# Patient Record
Sex: Male | Born: 1950 | Race: White | Hispanic: No | Marital: Married | Smoking: Never smoker
Health system: Southern US, Community
[De-identification: ages and names within clinical notes are randomized; demographics above are authoritative.]

## PROBLEM LIST (undated history)

## (undated) ENCOUNTER — Emergency Department (HOSPITAL_COMMUNITY): Admission: EM | Discharge status: Absent without leave | Payer: Self-pay

## (undated) DIAGNOSIS — J309 Allergic rhinitis, unspecified: Secondary | ICD-10-CM

## (undated) DIAGNOSIS — I10 Essential (primary) hypertension: Secondary | ICD-10-CM

## (undated) DIAGNOSIS — T148XXA Other injury of unspecified body region, initial encounter: Secondary | ICD-10-CM

## (undated) DIAGNOSIS — I77810 Thoracic aortic ectasia: Secondary | ICD-10-CM

## (undated) DIAGNOSIS — K56609 Unspecified intestinal obstruction, unspecified as to partial versus complete obstruction: Secondary | ICD-10-CM

## (undated) DIAGNOSIS — Z8719 Personal history of other diseases of the digestive system: Secondary | ICD-10-CM

## (undated) DIAGNOSIS — I6381 Other cerebral infarction due to occlusion or stenosis of small artery: Secondary | ICD-10-CM

## (undated) DIAGNOSIS — D693 Immune thrombocytopenic purpura: Secondary | ICD-10-CM

## (undated) DIAGNOSIS — R339 Retention of urine, unspecified: Secondary | ICD-10-CM

## (undated) DIAGNOSIS — F439 Reaction to severe stress, unspecified: Secondary | ICD-10-CM

## (undated) DIAGNOSIS — U071 COVID-19: Secondary | ICD-10-CM

## (undated) DIAGNOSIS — E785 Hyperlipidemia, unspecified: Secondary | ICD-10-CM

## (undated) DIAGNOSIS — M5432 Sciatica, left side: Secondary | ICD-10-CM

## (undated) DIAGNOSIS — G4700 Insomnia, unspecified: Secondary | ICD-10-CM

## (undated) DIAGNOSIS — K219 Gastro-esophageal reflux disease without esophagitis: Secondary | ICD-10-CM

## (undated) DIAGNOSIS — R002 Palpitations: Secondary | ICD-10-CM

## (undated) DIAGNOSIS — I700 Atherosclerosis of aorta: Secondary | ICD-10-CM

## (undated) DIAGNOSIS — K760 Fatty (change of) liver, not elsewhere classified: Secondary | ICD-10-CM

## (undated) DIAGNOSIS — K7581 Nonalcoholic steatohepatitis (NASH): Secondary | ICD-10-CM

## (undated) DIAGNOSIS — K439 Ventral hernia without obstruction or gangrene: Secondary | ICD-10-CM

## (undated) DIAGNOSIS — G2581 Restless legs syndrome: Secondary | ICD-10-CM

## (undated) DIAGNOSIS — N401 Enlarged prostate with lower urinary tract symptoms: Secondary | ICD-10-CM

## (undated) DIAGNOSIS — R7303 Prediabetes: Secondary | ICD-10-CM

## (undated) HISTORY — DX: Fatty (change of) liver, not elsewhere classified: K76.0

## (undated) HISTORY — DX: Other cerebral infarction due to occlusion or stenosis of small artery: I63.81

## (undated) HISTORY — DX: Sciatica, left side: M54.32

## (undated) HISTORY — DX: Thoracic aortic ectasia: I77.810

## (undated) HISTORY — DX: Palpitations: R00.2

## (undated) HISTORY — DX: Immune thrombocytopenic purpura: D69.3

## (undated) HISTORY — DX: Morbid (severe) obesity due to excess calories: E66.01

## (undated) HISTORY — DX: Other injury of unspecified body region, initial encounter: T14.8XXA

## (undated) HISTORY — DX: Allergic rhinitis, unspecified: J30.9

## (undated) HISTORY — DX: Restless legs syndrome: G25.81

## (undated) HISTORY — DX: Gilbert syndrome: E80.4

## (undated) HISTORY — DX: Benign prostatic hyperplasia with lower urinary tract symptoms: N40.1

## (undated) HISTORY — DX: Unspecified intestinal obstruction, unspecified as to partial versus complete obstruction: K56.609

## (undated) HISTORY — DX: Ventral hernia without obstruction or gangrene: K43.9

## (undated) HISTORY — DX: Insomnia, unspecified: G47.00

## (undated) HISTORY — DX: Nonalcoholic steatohepatitis (NASH): K75.81

## (undated) HISTORY — DX: Gastro-esophageal reflux disease without esophagitis: K21.9

## (undated) HISTORY — DX: COVID-19: U07.1

## (undated) HISTORY — DX: Retention of urine, unspecified: R33.9

## (undated) HISTORY — DX: Personal history of other diseases of the digestive system: Z87.19

## (undated) HISTORY — DX: Prediabetes: R73.03

## (undated) HISTORY — DX: Atherosclerosis of aorta: I70.0

## (undated) HISTORY — DX: Hyperlipidemia, unspecified: E78.5

## (undated) HISTORY — DX: Essential (primary) hypertension: I10

## (undated) HISTORY — DX: Reaction to severe stress, unspecified: F43.9

---

## 1998-01-06 ENCOUNTER — Ambulatory Visit (HOSPITAL_COMMUNITY): Admission: RE | Admit: 1998-01-06 | Discharge: 1998-01-06 | Payer: Self-pay | Admitting: Specialist

## 1998-01-19 ENCOUNTER — Ambulatory Visit (HOSPITAL_COMMUNITY): Admission: RE | Admit: 1998-01-19 | Discharge: 1998-01-20 | Payer: Self-pay | Admitting: Surgery

## 1998-02-03 ENCOUNTER — Ambulatory Visit (HOSPITAL_COMMUNITY): Admission: RE | Admit: 1998-02-03 | Discharge: 1998-02-03 | Payer: Self-pay | Admitting: Specialist

## 1998-02-06 ENCOUNTER — Ambulatory Visit (HOSPITAL_COMMUNITY): Admission: RE | Admit: 1998-02-06 | Discharge: 1998-02-06 | Payer: Self-pay | Admitting: Specialist

## 1998-02-13 ENCOUNTER — Ambulatory Visit (HOSPITAL_COMMUNITY): Admission: RE | Admit: 1998-02-13 | Discharge: 1998-02-13 | Payer: Self-pay | Admitting: Specialist

## 1998-02-27 ENCOUNTER — Ambulatory Visit (HOSPITAL_COMMUNITY): Admission: RE | Admit: 1998-02-27 | Discharge: 1998-03-01 | Payer: Self-pay | Admitting: Specialist

## 2000-03-04 ENCOUNTER — Encounter (INDEPENDENT_AMBULATORY_CARE_PROVIDER_SITE_OTHER): Payer: Self-pay

## 2000-03-04 ENCOUNTER — Inpatient Hospital Stay (HOSPITAL_COMMUNITY): Admission: EM | Admit: 2000-03-04 | Discharge: 2000-03-17 | Payer: Self-pay

## 2000-03-09 ENCOUNTER — Encounter: Payer: Self-pay | Admitting: General Surgery

## 2000-03-10 ENCOUNTER — Encounter: Payer: Self-pay | Admitting: General Surgery

## 2009-03-16 ENCOUNTER — Ambulatory Visit: Payer: Self-pay | Admitting: Hematology and Oncology

## 2009-03-20 LAB — CBC WITH DIFFERENTIAL/PLATELET
Eosinophils Absolute: 0.1 10*3/uL (ref 0.0–0.5)
HCT: 48.2 % (ref 38.4–49.9)
LYMPH%: 33.5 % (ref 14.0–49.0)
MCV: 91.7 fL (ref 79.3–98.0)
MONO#: 0.4 10*3/uL (ref 0.1–0.9)
MONO%: 6.4 % (ref 0.0–14.0)
NEUT#: 3.2 10*3/uL (ref 1.5–6.5)
NEUT%: 57.8 % (ref 39.0–75.0)
Platelets: 114 10*3/uL — ABNORMAL LOW (ref 140–400)
WBC: 5.6 10*3/uL (ref 4.0–10.3)

## 2009-03-20 LAB — COMPREHENSIVE METABOLIC PANEL
BUN: 13 mg/dL (ref 6–23)
CO2: 27 mEq/L (ref 19–32)
Calcium: 9.5 mg/dL (ref 8.4–10.5)
Chloride: 103 mEq/L (ref 96–112)
Creatinine, Ser: 1.09 mg/dL (ref 0.40–1.50)
Glucose, Bld: 100 mg/dL — ABNORMAL HIGH (ref 70–99)

## 2009-03-24 LAB — PROTEIN ELECTROPHORESIS, SERUM, WITH REFLEX
Albumin ELP: 66.6 % — ABNORMAL HIGH (ref 55.8–66.1)
Alpha-2-Globulin: 7.8 % (ref 7.1–11.8)
Beta Globulin: 6.4 % (ref 4.7–7.2)
Total Protein, Serum Electrophoresis: 7.6 g/dL (ref 6.0–8.3)

## 2009-03-27 ENCOUNTER — Ambulatory Visit (HOSPITAL_COMMUNITY): Admission: RE | Admit: 2009-03-27 | Discharge: 2009-03-27 | Payer: Self-pay | Admitting: Hematology and Oncology

## 2009-04-22 ENCOUNTER — Ambulatory Visit: Payer: Self-pay | Admitting: Hematology and Oncology

## 2009-04-24 LAB — CBC WITH DIFFERENTIAL/PLATELET
Basophils Absolute: 0 10*3/uL (ref 0.0–0.1)
Eosinophils Absolute: 0.1 10*3/uL (ref 0.0–0.5)
HCT: 46.8 % (ref 38.4–49.9)
LYMPH%: 38 % (ref 14.0–49.0)
MCV: 92.1 fL (ref 79.3–98.0)
MONO#: 0.4 10*3/uL (ref 0.1–0.9)
MONO%: 7.7 % (ref 0.0–14.0)
NEUT#: 2.6 10*3/uL (ref 1.5–6.5)
NEUT%: 52.5 % (ref 39.0–75.0)
Platelets: 84 10*3/uL — ABNORMAL LOW (ref 140–400)
WBC: 5 10*3/uL (ref 4.0–10.3)

## 2009-04-24 LAB — IVY BLEEDING TIME: Bleeding Time: 8.5 Minutes — ABNORMAL HIGH (ref 2.0–8.0)

## 2009-04-26 LAB — THROMBIN TIME

## 2009-05-06 ENCOUNTER — Ambulatory Visit (HOSPITAL_COMMUNITY): Admission: RE | Admit: 2009-05-06 | Discharge: 2009-05-06 | Payer: Self-pay | Admitting: Hematology and Oncology

## 2009-05-06 ENCOUNTER — Encounter (INDEPENDENT_AMBULATORY_CARE_PROVIDER_SITE_OTHER): Payer: Self-pay | Admitting: Interventional Radiology

## 2009-05-08 LAB — CBC WITH DIFFERENTIAL/PLATELET
Basophils Absolute: 0 10*3/uL (ref 0.0–0.1)
Eosinophils Absolute: 0.1 10*3/uL (ref 0.0–0.5)
HGB: 16.6 g/dL (ref 13.0–17.1)
MCV: 91.1 fL (ref 79.3–98.0)
MONO#: 0.3 10*3/uL (ref 0.1–0.9)
MONO%: 6.3 % (ref 0.0–14.0)
NEUT#: 3 10*3/uL (ref 1.5–6.5)
Platelets: 86 10*3/uL — ABNORMAL LOW (ref 140–400)
RBC: 5.11 10*6/uL (ref 4.20–5.82)
RDW: 13.2 % (ref 11.0–14.6)
WBC: 5.4 10*3/uL (ref 4.0–10.3)

## 2009-06-02 ENCOUNTER — Ambulatory Visit: Payer: Self-pay | Admitting: Hematology and Oncology

## 2009-06-04 LAB — CBC WITH DIFFERENTIAL/PLATELET
BASO%: 0.5 % (ref 0.0–2.0)
Basophils Absolute: 0 10e3/uL (ref 0.0–0.1)
EOS%: 0.9 % (ref 0.0–7.0)
Eosinophils Absolute: 0 10e3/uL (ref 0.0–0.5)
HCT: 46 % (ref 38.4–49.9)
HGB: 16.2 g/dL (ref 13.0–17.1)
LYMPH%: 32 % (ref 14.0–49.0)
MCH: 32.6 pg (ref 27.2–33.4)
MCHC: 35.3 g/dL (ref 32.0–36.0)
MCV: 92.3 fL (ref 79.3–98.0)
MONO#: 0.5 10e3/uL (ref 0.1–0.9)
MONO%: 9 % (ref 0.0–14.0)
NEUT#: 3.1 10e3/uL (ref 1.5–6.5)
NEUT%: 57.6 % (ref 39.0–75.0)
Platelets: 79 10e3/uL — ABNORMAL LOW (ref 140–400)
RBC: 4.98 10e6/uL (ref 4.20–5.82)
RDW: 13.1 % (ref 11.0–14.6)
WBC: 5.4 10e3/uL (ref 4.0–10.3)
lymph#: 1.7 10e3/uL (ref 0.9–3.3)

## 2009-07-07 ENCOUNTER — Ambulatory Visit: Payer: Self-pay | Admitting: Hematology and Oncology

## 2009-07-09 LAB — COMPREHENSIVE METABOLIC PANEL
Albumin: 4.3 g/dL (ref 3.5–5.2)
Alkaline Phosphatase: 57 U/L (ref 39–117)
Calcium: 9.3 mg/dL (ref 8.4–10.5)
Chloride: 103 mEq/L (ref 96–112)
Glucose, Bld: 97 mg/dL (ref 70–99)
Potassium: 4 mEq/L (ref 3.5–5.3)
Sodium: 143 mEq/L (ref 135–145)
Total Protein: 6.2 g/dL (ref 6.0–8.3)

## 2009-07-09 LAB — CBC WITH DIFFERENTIAL/PLATELET
Eosinophils Absolute: 0.1 10*3/uL (ref 0.0–0.5)
HGB: 16.1 g/dL (ref 13.0–17.1)
MCV: 93.1 fL (ref 79.3–98.0)
MONO#: 0.4 10*3/uL (ref 0.1–0.9)
MONO%: 6.3 % (ref 0.0–14.0)
NEUT#: 2.9 10*3/uL (ref 1.5–6.5)
RBC: 4.88 10*6/uL (ref 4.20–5.82)
RDW: 13.4 % (ref 11.0–14.6)
WBC: 5.8 10*3/uL (ref 4.0–10.3)
lymph#: 2.3 10*3/uL (ref 0.9–3.3)

## 2009-07-10 ENCOUNTER — Ambulatory Visit: Payer: Self-pay | Admitting: Hematology & Oncology

## 2009-07-13 LAB — CBC WITH DIFFERENTIAL (CANCER CENTER ONLY)
BASO#: 0.1 10*3/uL (ref 0.0–0.2)
Eosinophils Absolute: 0.2 10*3/uL (ref 0.0–0.5)
HGB: 16.8 g/dL (ref 13.0–17.1)
MCH: 32.7 pg (ref 28.0–33.4)
MCV: 94 fL (ref 82–98)
MONO#: 0.2 10*3/uL (ref 0.1–0.9)
MONO%: 3.9 % (ref 0.0–13.0)
NEUT#: 3.8 10*3/uL (ref 1.5–6.5)
RBC: 5.13 10*6/uL (ref 4.20–5.70)
WBC: 6 10*3/uL (ref 4.0–10.0)

## 2009-07-13 LAB — COMPREHENSIVE METABOLIC PANEL
Alkaline Phosphatase: 61 U/L (ref 39–117)
BUN: 9 mg/dL (ref 6–23)
Glucose, Bld: 130 mg/dL — ABNORMAL HIGH (ref 70–99)
Sodium: 140 mEq/L (ref 135–145)
Total Bilirubin: 1.4 mg/dL — ABNORMAL HIGH (ref 0.3–1.2)
Total Protein: 6.6 g/dL (ref 6.0–8.3)

## 2009-10-22 ENCOUNTER — Ambulatory Visit: Payer: Self-pay | Admitting: Hematology & Oncology

## 2009-10-23 LAB — CBC WITH DIFFERENTIAL (CANCER CENTER ONLY)
BASO#: 0 10*3/uL (ref 0.0–0.2)
BASO%: 0.8 % (ref 0.0–2.0)
EOS%: 2.3 % (ref 0.0–7.0)
Eosinophils Absolute: 0.1 10*3/uL (ref 0.0–0.5)
HCT: 47.7 % (ref 38.7–49.9)
HGB: 16.5 g/dL (ref 13.0–17.1)
LYMPH#: 2.1 10*3/uL (ref 0.9–3.3)
LYMPH%: 39.6 % (ref 14.0–48.0)
MCH: 32.8 pg (ref 28.0–33.4)
MCHC: 34.7 g/dL (ref 32.0–35.9)
MCV: 94 fL (ref 82–98)
MONO#: 0.2 10*3/uL (ref 0.1–0.9)
MONO%: 3.5 % (ref 0.0–13.0)
NEUT#: 2.8 10*3/uL (ref 1.5–6.5)
NEUT%: 53.8 % (ref 40.0–80.0)
Platelets: 76 10*3/uL — ABNORMAL LOW (ref 145–400)
RBC: 5.05 10*6/uL (ref 4.20–5.70)
RDW: 11.2 % (ref 10.5–14.6)
WBC: 5.3 10*3/uL (ref 4.0–10.0)

## 2009-10-23 LAB — CHCC SATELLITE - SMEAR

## 2010-02-17 ENCOUNTER — Ambulatory Visit: Payer: Self-pay | Admitting: Hematology & Oncology

## 2010-02-19 LAB — CBC WITH DIFFERENTIAL (CANCER CENTER ONLY)
BASO#: 0.1 10*3/uL (ref 0.0–0.2)
BASO%: 1.8 % (ref 0.0–2.0)
EOS%: 2.4 % (ref 0.0–7.0)
Eosinophils Absolute: 0.1 10*3/uL (ref 0.0–0.5)
HCT: 49.7 % (ref 38.7–49.9)
HGB: 17 g/dL (ref 13.0–17.1)
LYMPH#: 1.9 10*3/uL (ref 0.9–3.3)
LYMPH%: 40.8 % (ref 14.0–48.0)
MCH: 32.6 pg (ref 28.0–33.4)
MCHC: 34.2 g/dL (ref 32.0–35.9)
MCV: 95 fL (ref 82–98)
MONO#: 0.2 10*3/uL (ref 0.1–0.9)
MONO%: 4.2 % (ref 0.0–13.0)
NEUT#: 2.3 10*3/uL (ref 1.5–6.5)
NEUT%: 50.8 % (ref 40.0–80.0)
Platelets: 78 10*3/uL — ABNORMAL LOW (ref 145–400)
RBC: 5.21 10*6/uL (ref 4.20–5.70)
RDW: 11.4 % (ref 10.5–14.6)
WBC: 4.6 10*3/uL (ref 4.0–10.0)

## 2010-02-19 LAB — CHCC SATELLITE - SMEAR

## 2010-08-18 ENCOUNTER — Ambulatory Visit: Payer: Self-pay | Admitting: Hematology & Oncology

## 2010-08-20 LAB — CBC WITH DIFFERENTIAL (CANCER CENTER ONLY)
BASO%: 1 % (ref 0.0–2.0)
EOS%: 2.2 % (ref 0.0–7.0)
LYMPH%: 34.2 % (ref 14.0–48.0)
MCH: 32 pg (ref 28.0–33.4)
MCHC: 33.9 g/dL (ref 32.0–35.9)
MCV: 95 fL (ref 82–98)
MONO%: 6 % (ref 0.0–13.0)
Platelets: 68 10*3/uL — ABNORMAL LOW (ref 145–400)
RDW: 12.2 % (ref 10.5–14.6)

## 2010-08-20 LAB — CHCC SATELLITE - SMEAR

## 2011-01-08 LAB — CBC
HCT: 47 % (ref 39.0–52.0)
Platelets: 77 10*3/uL — ABNORMAL LOW (ref 150–400)
RDW: 13.1 % (ref 11.5–15.5)

## 2011-01-08 LAB — PROTIME-INR: INR: 1.1 (ref 0.00–1.49)

## 2011-01-08 LAB — BONE MARROW EXAM

## 2011-02-11 ENCOUNTER — Other Ambulatory Visit: Payer: Self-pay | Admitting: Hematology & Oncology

## 2011-02-11 ENCOUNTER — Encounter (HOSPITAL_BASED_OUTPATIENT_CLINIC_OR_DEPARTMENT_OTHER): Payer: 59 | Admitting: Hematology & Oncology

## 2011-02-11 DIAGNOSIS — D693 Immune thrombocytopenic purpura: Secondary | ICD-10-CM

## 2011-02-11 DIAGNOSIS — D696 Thrombocytopenia, unspecified: Secondary | ICD-10-CM

## 2011-02-11 LAB — CBC WITH DIFFERENTIAL (CANCER CENTER ONLY)
BASO%: 0.4 % (ref 0.0–2.0)
EOS%: 1.8 % (ref 0.0–7.0)
LYMPH#: 1.9 10*3/uL (ref 0.9–3.3)
MCH: UNDETERMINED pg (ref 28.0–33.4)
MCHC: UNDETERMINED g/dL (ref 32.0–35.9)
MONO%: 6.6 % (ref 0.0–13.0)
NEUT#: 3.1 10*3/uL (ref 1.5–6.5)
Platelets: 62 10*3/uL — ABNORMAL LOW (ref 145–400)
RDW: UNDETERMINED % (ref 11.1–15.7)

## 2011-02-18 NOTE — H&P (Signed)
[facility]  Patient:    Eric Mckenzie, Eric Mckenzie                    MRN: [id number] Adm. Date:  [id number] Attending:  Henrene Dodge CC:         Anselm Pancoast. Zachery Dakins, M.D.                         History and Physical  CHIEF COMPLAINT:  Abdominal distention and nausea.  HISTORY:  Eric Mckenzie is a 60 year old overweight Caucasian male who was referred from the [facility] this morning where he presented with a 48-hour history of bloating, cramping, abdominal distention and feeling like he may vomit.  On findings, he was diffusely tender throughout his abdomen, not really localized to any quadrant of the abdomen, and his only previous surgery has been a laparoscopic cholecystectomy and a little umbilical hernia that had been repaired.  In the emergency room, we got a flat and upright abdominal film that showed very dilated loops of small bowel with multiple air-fluid levels and then there was a little gas in the colon.  I placed a nasogastric tube.  His lab studies were basically unremarkable except for hematocrit that was elevated because he was dehydrated.  With the NG tube, about 1200 cc were removed, and I recommended we proceed on with exploratory laparotomy after he was rehydrated.  Patient was in agreement with this.  ALLERGIES:  None.  CHRONIC MEDICATIONS:  I do not think he is on any.  PREVIOUS SURGERIES:  Of course he had a knee operation and he has had a laparoscopic cholecystectomy.  FAMILY HISTORY:  Unremarkable.  He is married and has two children.  REVIEW OF SYSTEMS:  HEENT:  Negative.  No chronic pulmonary problems.  I do not think he smokes.  Denies chronic problem with his bowels.  PHYSICAL EXAMINATION:  GENERAL:  He is an overweight Caucasian male in mild distress, tachycardic, but is not febrile.  VITAL SIGNS:  In the emergency room here, his pulse was 119.  Blood pressure was mildly elevated, but it had  not been elevated at the [facility].  We measured at 145; I think it was 106.  Respirations were unremarkable. Temperature was normal.  HEENT:  Unremarkable.  Appears slightly dehydrated.  Nasogastric tube was placed in the ER.  LUNGS:  Clear.  CARDIAC:  Sinus tachycardia without murmur, gallop or rub.  ABDOMEN:  High-pitched bowel sounds, vaguely tender in all four quadrants but not acutely tender like rigid.  RECTAL:  There was brown Hemoccult stool in the rectum, not very hard.  EXTREMITIES:  Unremarkable.  CNS:  Physiologic.  SKIN:  Unremarkable.  ADMISSION IMPRESSION:  Small-bowel obstruction, probably adhesions.  PLAN:  The patient will be rehydrated, nasogastric tube and plan for exploratory laparotomy later this afternoon. DD:  03/04/00 TD:  03/06/00 Job: [id number]

## 2011-02-18 NOTE — Op Note (Signed)
[facility]  Patient:    Eric Mckenzie, Eric Mckenzie                    MRN: [id number] Proc. Date: 03/04/00 Adm. Date:  [id number] Attending:  Henrene Dodge CC:         Gordy Savers, M.D. [facility]                           Operative Report  PREOPERATIVE DIAGNOSIS:  Small-bowel obstruction, probably adhesions.  POSTOPERATIVE DIAGNOSIS:  Small-bowel obstruction, ______ of adhesions.  OPERATION:  Exploratory laparotomy, lysis of adhesions and incidental appendectomy.  SURGEON:  Anselm Pancoast. Zachery Dakins, M.D.  ASSISTANT:  Nurse.  ANESTHESIA:  General.  INDICATIONS:  Eric Mckenzie is a 60 year old Caucasian male, moderately overweight who approximately for 48 hours has had progressive abdominal bloating, nausea and not actually vomited, saw his physician this morning.  He is normally followed by Dr. Amador Cunas, at the [facility] where they found him bloated and diffusely tender and referred him over to the ER and asked me to see him.  On physical examination he had sort of cramping high-pitch obstructive type bowel sounds.  X-rays confirmed that he had a small-bowel obstruction and his lab studies were basically unremarkable except that he was dehydrated and hematocrit was about 53.  A nasogastric tube was placed, started on intravenous fluids, and I recommended that we proceed with exploratory laparotomy.  After the patient had received about two liters of fluids his pulse had come down from about 119 to about 100 and he was taken to surgery where we gave him 3 g of Unasyn preoperatively.  DESCRIPTION OF PROCEDURE:  After being prepped with Betadine surgical scrub and solution, the patients midline incision was opened up.  He had had a previous laparoscopic cholecystectomy approximately two years ago.  A small midline incision from above and below the umbilicus was made and upon entering into the peritoneal cavity, there were markedly  dilated loops of small bowel, a lot of fluid within the peritoneum, exudate type but never foul smelling or bloody.  The incision was extended since the obstruction appeared to be down along the appendiceal area.  In opening up the incision and following the very dilated loops of small bowel down at approximately about a foot from the ileocecal valve there was appendices epiploic from the sigmoid colon sort of adherent to the omentum and this was kind of crossed over the small bowel, the point of obstruction.  This was released and the air went onto the cecum and the appendix was kind of in a retrocecal position and not acutely inflamed.  I milked back the small bowel contents and after we got a little better exposure, then I could sort of rotate the cecum forward and went ahead and freed up the retrocecal appendix and did an incidental appendectomy.  The appendix was cross-clamped about a  inch from its base with 2-0 Vicryl and then the appendicial stump inverted with 3-0 silk.  The small bowel was then placed back in anatomical position.  In his colon he has kind of a moderate amount of diverticulosis and very large appendices of the plica; part of this of course is related to his obesity, but I expect that he has had an element of diverticulosis also.  There was some adhesions up around the gallbladder but no evidence of obstruction and as far as ______ palpation of the  colon best I could tell.  I cannot find any lesions.  There is some stool in the sigmoid colon and rectum but the transverse colon and other parts of the colon were not dilated.  The omentum was freed up and brought down over the small bowel and then the midline was closed with a figure-of-eight sutures of #1 Novofil.  The skin was closed with staples.  The patient tolerated the procedure satisfactory and we placed Foley catheter and he had about 300 cc of urine output.  We will plan on keeping the nasogastric tube and I  am going to use PCA morphine for postop pain control. DD:  03/04/00 TD:  03/08/00 Job: [id number]

## 2011-02-18 NOTE — Discharge Summary (Signed)
[facility]  Patient:    Eric Mckenzie, Eric Mckenzie                    MRN: [id number] Adm. Date:  [id number] Disc. Date: [id number] Attending:  Henrene Dodge CC:         Anselm Pancoast. Zachery Dakins, M.D.             Gordy Savers, M.D. [facility]                           Discharge Summary  DISCHARGE DIAGNOSES: 1. Small bowel obstruction secondary to adhesions. 2. Postoperative ileus.  OPERATIONS:  Exploratory laparotomy and lysis of adhesions and incidental appendectomy.  HISTORY OF PRESENT ILLNESS:  Eric Mckenzie is a 60 year old moderately overweight Caucasian male who was admitted through the ER on March 04, 2000, with a several week history of kind of progressive abdominal bloating but then a several day history of marked bloating, nausea, and feeling like he was just getting ready to vomit.  When he was seen in the emergency room, the ER physician, Gordy Savers, M.D., is his regular physician, x-rays were obtained that showed multiple air-fluid levels with maximum distention of the small bowel, very little if any gas within the colon, and his lab studies showed white count of 9500 but a hematocrit of 52.  His CMET showed basically normal electrolytes.  His creatinine was 1.2, BUN of 12, and on examination he had a very distended, quite tight abdomen.  His admission bilirubin was slightly elevated at 1.7, but his other liver function studies were normal.  I placed a nasogastric tube, got a moderate amount of bilious drainage, and re-examined him an hour or two later and felt that with his really tight abdominal distention, it would be best to proceed on with exploratory laparotomy for mechanical small bowel obstruction.  HOSPITAL COURSE:  He was taken to surgery, and at surgery he had a very dilated small bowel.  The point of obstruction was down approximately two feet from the terminal ileum and related to basically an appendices epiploica  on the colon kind of partially blocking this area.  With this being released, the gas went on into the colon and he was also noted to have a moderate amount of stool throughout the transverse and even right colon as well as left colon. 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  He noted that he was kind of bloating, cramping, and that this was kind of more of a gradual than just a couple of day episode of abdominal pain and distention.  Postoperatively we kept the nasogastric tube, and I had basically milked the contents back into the stomach that we could remove with the NG tube, probably two or more liters of stuff was removed, and then postoperatively he continued just having kind of a bloated, bilious NG drainage and then with Dulcolax suppositories and a Fleets enema, etc., and on about the fifth day he started having some bowel function.  At this time his abdomen was much flatter.  We removed the nasogastric tube but over a period of 24 hours, he definitely became more bloated.  He had another bowel movement but was obviously nauseated, and it was necessary to replace the NG  tube.  At this point, I was concerned that was there some way I could have possibly missed a colon obstruction, and thought that it would be best to check the colon, and this was done with a Gastrografin enema, and this was done on the 7th, which was about six days after surgery, and at that time there was still solid stool in the transverse colon but there was no evidence of any obstruction or anything in the colon itself.  On the delayed films, we had refluxed up into the terminal ileum.  We continued with the nasogastric tube and over the next about three days, he continued to basically have small bowel movements, passed a little gas, still was having a  bilious NG drainage, and still was somewhat nauseated.  I was off that particular weekend, and on returning on Monday, I elected to go ahead and start him on Reglan and with starting on the Reglan, there was a fairly prompt decreased NG drainage.  The drainage that previously had been bilious became nonbilious, and then we clamped the NG tube over a couple of days and he continued to be passing gas and bowels working satisfactorily.  I therefore removed the nasogastric tube approximately three days ago, kept him n.p.o. that day, and he had no further nausea, and then started him on a full liquid diet, which he has tolerated without any problems.  Yesterday, 24 hours after starting the full liquid diet, we stopped the Reglan intravenously and gave it to him p.o., and he has continued to have good bowel function, not bloated, abdomen soft.  His NG tube has now been removed, and I think he is ready for discharge in improved condition at this time.  On repeat lab studies about four or five days ago, it was noted that his bilirubin that had been 1.7 on admission was down to about 1.1, and a slight increased SGOT and SGPT of 115 and 191, respectively, but he has never had any pain up in the right upper quadrant.  Now that his bowels are working, his bilirubin is down to 1.1, and his SGOT and SGPT are better, but they are not perfectly normal at this time. He is having no abdominal pain, and I think he is ready for discharge in improved condition.  He is no longer requiring any type of pain medication, and I think he is extremely sensitive to the narcotic.  We were using PCA morphine in the immediate postoperative period, and I think this has probably contributed to the postoperative ileus.  I think we will discharge him only on Reglan and if he has any pain, he can take a Tylenol or an aspirin, and I would like to see him in the office in approximately three to four days. I have instructed him to  gradually increase the solid nature of his diet so that over a period of about two to three days, he is up to a regular diet.  I will  continue on the Reglan at least for a week, and then we will sort of taper it off and see if there is any needed afterwards.  I will plan on seeing him in the office in approximately five days.  His staples have been removed, his wound steri-stripped, and his wound is healing without problems.  He is discharged in improved condition.  He hopes to be able to return to work in approximately one week, which will be approximately three weeks from the time  of his surgery. DD:  03/17/00 TD:  03/22/00 Job: [id number]

## 2011-03-10 IMAGING — CT CT BIOPSY
1 series · 15 of 32 positions shown, 19 images · non-contrast
Comparison: none

CLINICAL DATA: Unexplained thrombocytopenia.  The patient requires
bone marrow biopsy.

[Series 2: routine pelvis · axial · 0.81mm/px · z∈[-180,-94]mm · 15 of 43 slices shown, 19 images]
[im 3/43  soft-tissue]
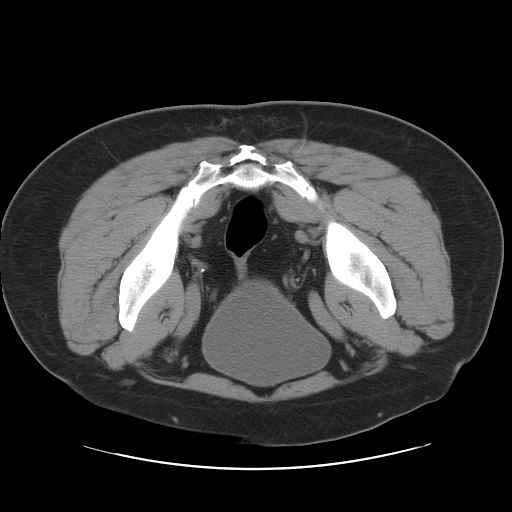
[im 3/43  bone]
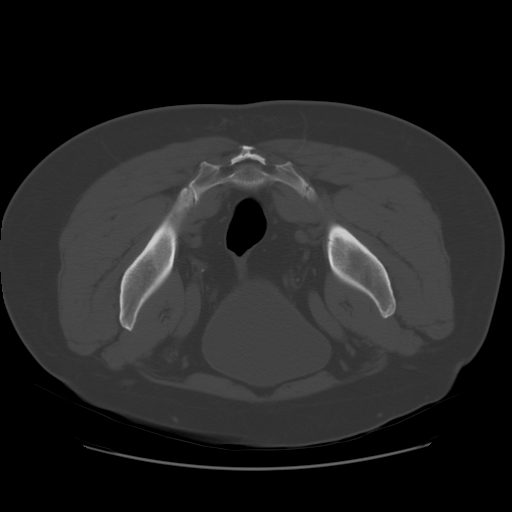
[im 6/43  soft-tissue]
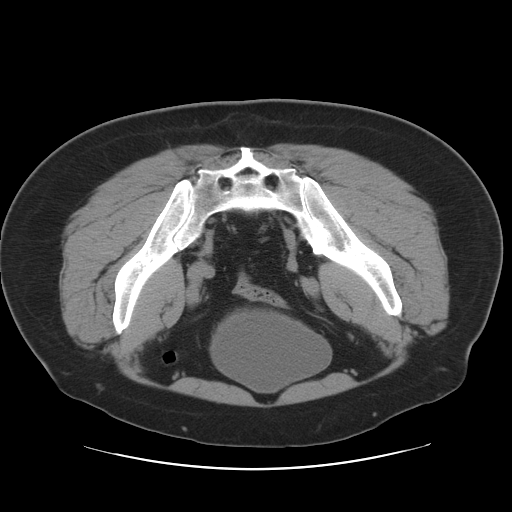
[im 9/43  soft-tissue]
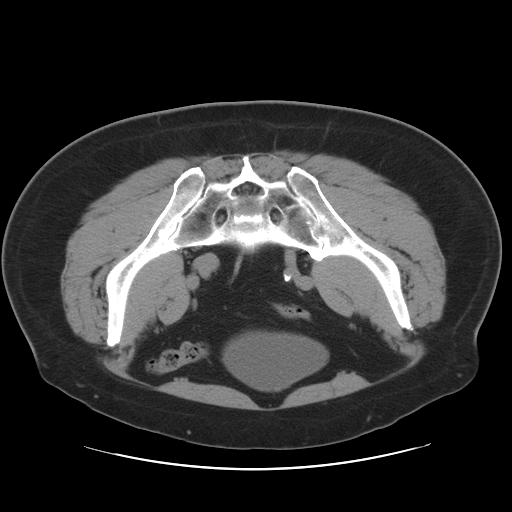
[im 13/43  soft-tissue]
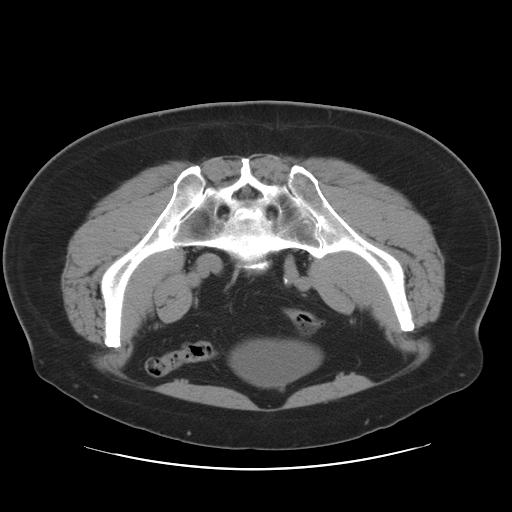
[im 15/43  soft-tissue]
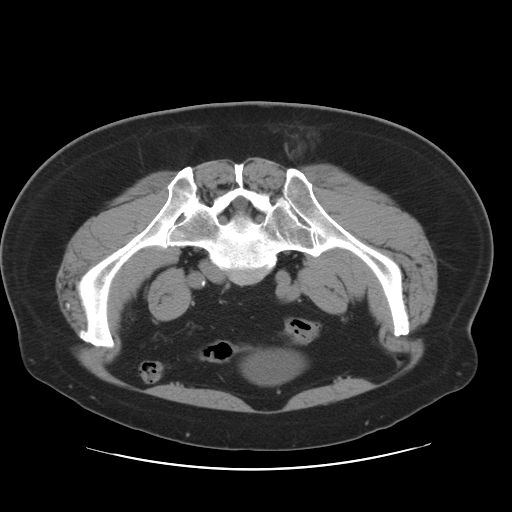
[im 18/43  soft-tissue]
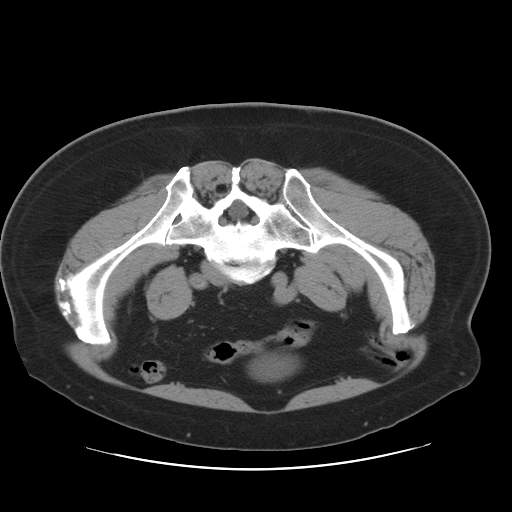
[im 22/43  soft-tissue]
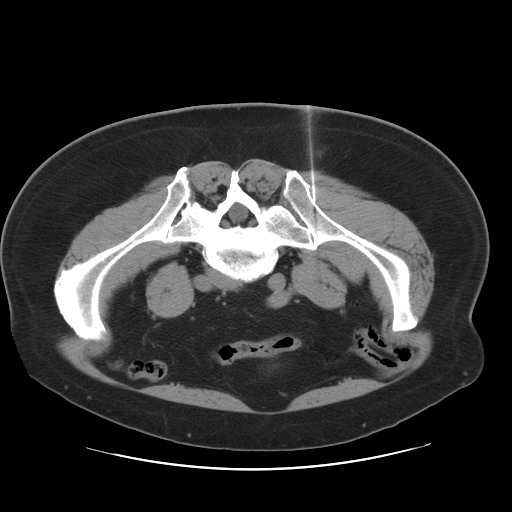
[im 25/43  soft-tissue]
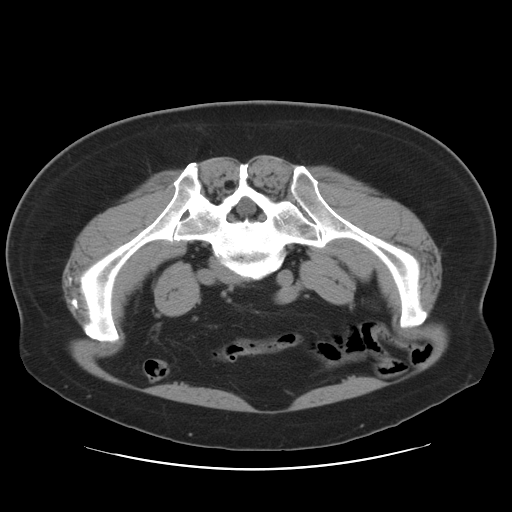
[im 28/43  soft-tissue]
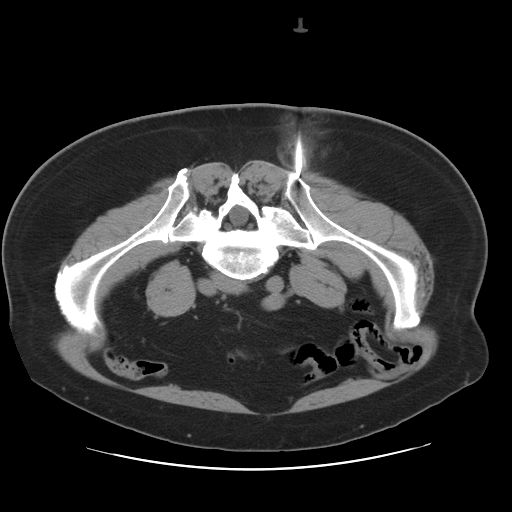
[im 28/43  bone]
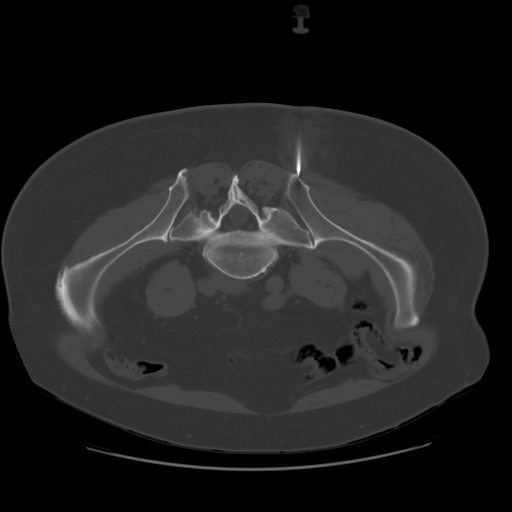
[im 30/43  soft-tissue]
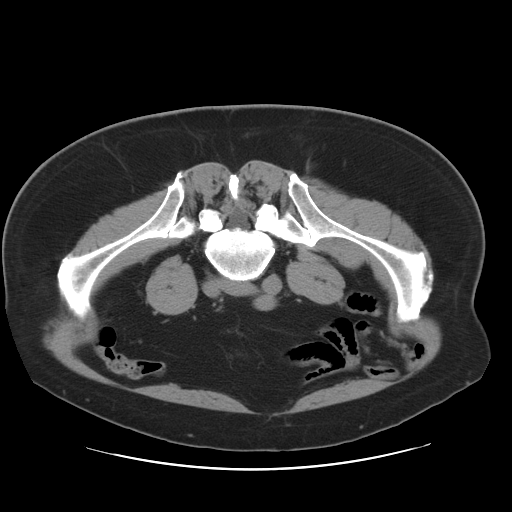
[im 34/43  soft-tissue]
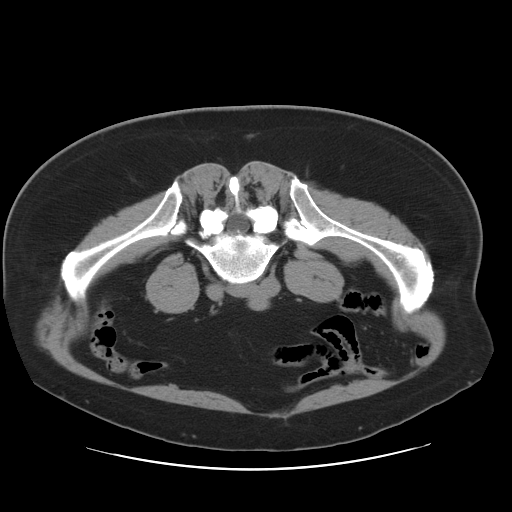
[im 37/43  soft-tissue]
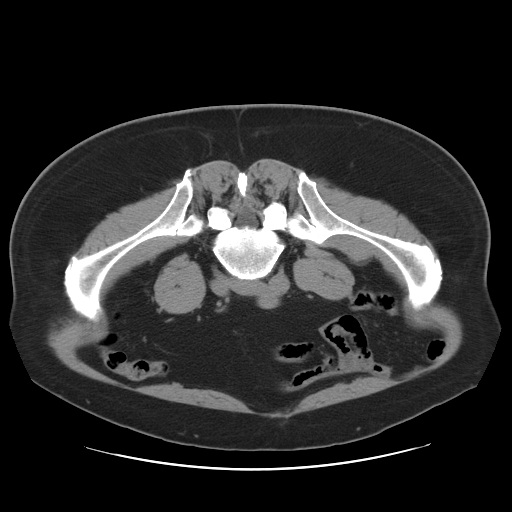
[im 37/43  lung]
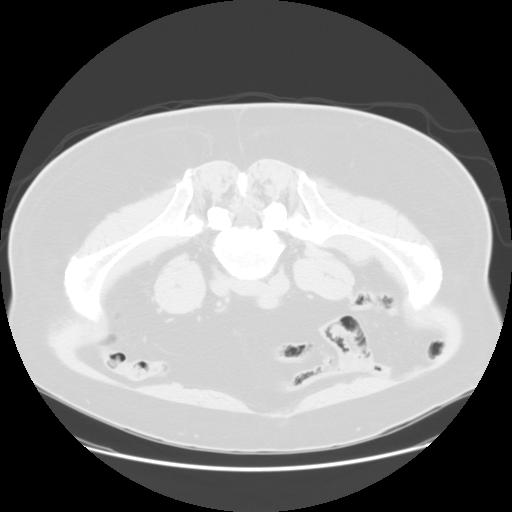
[im 38/43  lung]
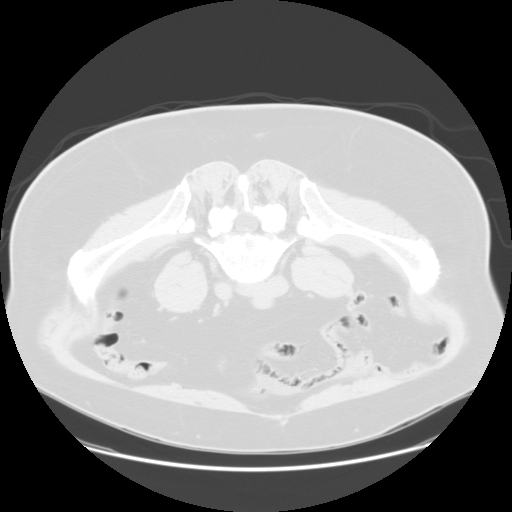
[im 40/43  soft-tissue]
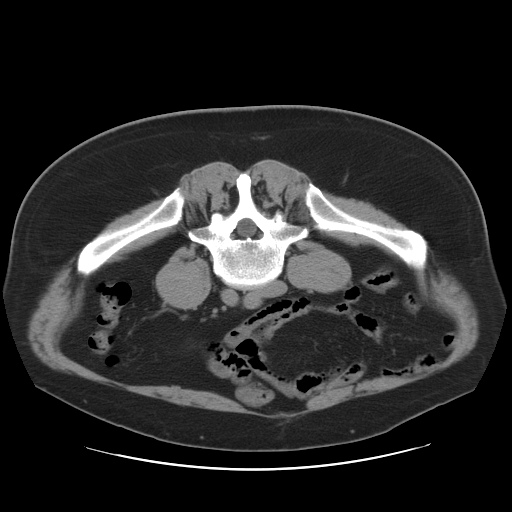
[im 40/43  lung]
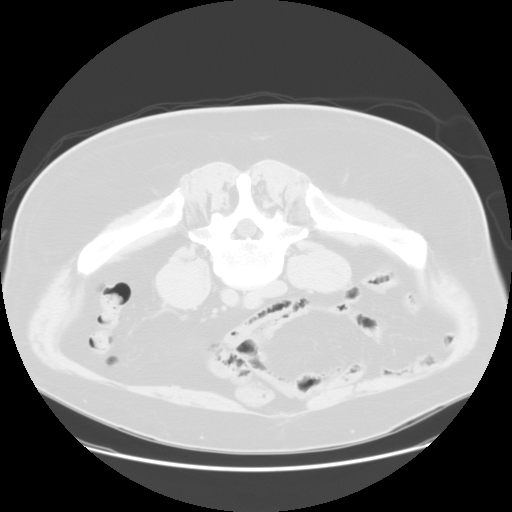
[im 41/43  lung]
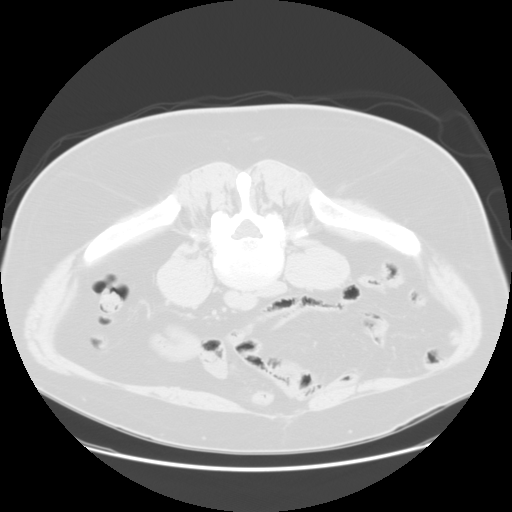

[15 of 32 positions shown; findings below may reference images not displayed]

CT GUIDED ASPIRATE AND CORE BIOPSY OF RIGHT ILIAC BONE MARROW

Sedation: Versed 3.0 mg IV, Fentanyl 150 mcg IV

Total Moderate Sedation Time: 25 minutes.

Procedure:  The procedure risks, benefits, and alternatives were
explained to the patient.  Questions regarding the procedure were
encouraged and answered.  The patient understands and consents to
the procedure.

The right gluteal region was prepped with Betadine.  Sterile gown
and sterile gloves were used for the procedure.  Local anesthesia
was provided with 1% Lidocaine.

Under CT guidance, an 11 gauge bone cutting needle was advanced
from a posterior approach into the right iliac bone.  Needle
positioning was confirmed with CT.  Initial non heparinized and
heparinized aspirate samples were obtained of bone marrow.

Core biopsy was performed with coaxial placement of a 14 gauge core
biopsy needle.  Multiple core samples of the marrow were obtained.

Complications: None
FINDINGS: Inspection of initial aspirate did reveal visible
particles.  Intact core biopsy samples were able to be obtained.
IMPRESSION: CT guided bone marrow biopsy of right posterior iliac bone with
both aspirate and core samples obtained.

## 2011-07-07 ENCOUNTER — Other Ambulatory Visit: Payer: Self-pay | Admitting: Family Medicine

## 2011-07-07 DIAGNOSIS — R7989 Other specified abnormal findings of blood chemistry: Secondary | ICD-10-CM

## 2011-07-08 ENCOUNTER — Ambulatory Visit
Admission: RE | Admit: 2011-07-08 | Discharge: 2011-07-08 | Disposition: A | Discharge status: Home / Self Care | Payer: 59 | Source: Ambulatory Visit | Attending: Family Medicine | Admitting: Family Medicine

## 2011-07-08 DIAGNOSIS — R7989 Other specified abnormal findings of blood chemistry: Secondary | ICD-10-CM

## 2011-10-31 ENCOUNTER — Telehealth: Payer: Self-pay | Admitting: Hematology & Oncology

## 2011-10-31 NOTE — Telephone Encounter (Signed)
Pt called left message he wanted to be seen. I called left message to call and schedule appointment.

## 2011-10-31 NOTE — Telephone Encounter (Signed)
Pt aware of 2-15 appointment

## 2011-11-18 ENCOUNTER — Other Ambulatory Visit (HOSPITAL_BASED_OUTPATIENT_CLINIC_OR_DEPARTMENT_OTHER): Payer: 59 | Admitting: Lab

## 2011-11-18 ENCOUNTER — Telehealth: Payer: Self-pay | Admitting: Hematology & Oncology

## 2011-11-18 ENCOUNTER — Ambulatory Visit (HOSPITAL_BASED_OUTPATIENT_CLINIC_OR_DEPARTMENT_OTHER): Payer: 59 | Admitting: Hematology & Oncology

## 2011-11-18 ENCOUNTER — Encounter: Payer: Self-pay | Admitting: Hematology & Oncology

## 2011-11-18 VITALS — BP 132/86 | HR 64 | Temp 97.4°F | Ht 72.0 in | Wt 257.0 lb

## 2011-11-18 DIAGNOSIS — D693 Immune thrombocytopenic purpura: Secondary | ICD-10-CM

## 2011-11-18 HISTORY — DX: Immune thrombocytopenic purpura: D69.3

## 2011-11-18 LAB — CHCC SATELLITE - SMEAR

## 2011-11-18 LAB — CBC WITH DIFFERENTIAL (CANCER CENTER ONLY)
Eosinophils Absolute: 0.1 10*3/uL (ref 0.0–0.5)
LYMPH#: 2 10*3/uL (ref 0.9–3.3)
MONO#: 0.4 10*3/uL (ref 0.1–0.9)
NEUT#: 3.2 10*3/uL (ref 1.5–6.5)
Platelets: 70 10*3/uL — ABNORMAL LOW (ref 145–400)
RBC: 5.35 10*6/uL (ref 4.20–5.70)
WBC: 5.8 10*3/uL (ref 4.0–10.0)

## 2011-11-18 NOTE — Progress Notes (Signed)
This office note has been dictated.

## 2011-11-18 NOTE — Telephone Encounter (Signed)
Mailed 2-214-14 schedule

## 2011-11-18 NOTE — Progress Notes (Signed)
CC:   Eric Mckenzie. Eric Mckenzie, M.D.  DIAGNOSIS:  Chronic immune thrombocytopenia.  CURRENT THERAPY:  Observation.  INTERVAL HISTORY:  Eric Mckenzie comes in for followup.  I saw him back in May.  Since that time he has been doing well.  He does have occasional bruise.  When he cuts himself, he says that he does not bleed.  He has had no problems with nausea or vomiting.  There has been no change in medications.  He apparently is dealing with a "fatty liver". Dr. Cliffton Mckenzie is having to manage this.  He has had no problems with fevers, sweats or chills.  He has had no bony aches or pains.  He has had no rashes.  PHYSICAL EXAMINATION:  General:  This is a well-developed, well- nourished white gentleman in no obvious distress.  Vital signs:  Show a temperature of 97.4, pulse 64, respiratory rate 18, blood pressure 132/86.  Weight is 257.  Head and neck:  Exam shows a normocephalic, atraumatic skull.  There are no ocular or oral lesions.  There are no palpable cervical, supraclavicular lymph nodes.  Lungs:  Clear bilaterally.  Cardiac:  Regular rate and rhythm with a normal S1 and S2. There are no murmurs, rubs or bruits.  Abdomen:  Soft with good bowel sounds.  There is no palpable abdominal mass.  There is no fluid wave. There is no palpable hepatosplenomegaly.  Back:  No tenderness over the spine, ribs or hips.  Extremities:  Shows no clubbing, cyanosis or edema.  Neurological:  Exam shows no focal neurological deficits.  Skin: Exam shows some very scattered ecchymoses.  LABORATORY STUDIES:  White cell count is 5.8, hemoglobin 17.1, hematocrit 36.4, platelet count 70,000.  Eric Mckenzie peripheral blood smear shows platelets to be large in size.  He has well-granulated platelets.  There are no enucleated red blood cells.  There are no atypical lymphocytes.  There are no immature myeloid cells.  IMPRESSION:  Eric Mckenzie is a 61 year old gentleman with chronic immune thrombocytopenia.  We  have been following him now for almost 3 years. His platelet count has been holding fairly stable.  He did have a bone marrow biopsy done back in I think 2010.  Again he is asymptomatic.  I really think that we could probably get him back yearly at this point in time.  I told him that he could certainly come back to see [place] beforehand if there are problems.    ______________________________ Eric Mckenzie, M.D. PRE/MEDQ  D:  11/18/2011  T:  11/18/2011  Job:  [id number]

## 2012-11-16 ENCOUNTER — Ambulatory Visit: Payer: 59 | Admitting: Hematology & Oncology

## 2012-11-16 ENCOUNTER — Other Ambulatory Visit: Payer: 59 | Admitting: Lab

## 2012-11-23 ENCOUNTER — Ambulatory Visit (HOSPITAL_BASED_OUTPATIENT_CLINIC_OR_DEPARTMENT_OTHER): Payer: BC Managed Care – PPO | Admitting: Hematology & Oncology

## 2012-11-23 ENCOUNTER — Other Ambulatory Visit: Payer: Self-pay | Admitting: Medical

## 2012-11-23 ENCOUNTER — Other Ambulatory Visit (HOSPITAL_BASED_OUTPATIENT_CLINIC_OR_DEPARTMENT_OTHER): Payer: BC Managed Care – PPO | Admitting: Lab

## 2012-11-23 VITALS — BP 106/71 | HR 70 | Temp 98.1°F | Resp 18 | Ht 72.0 in | Wt 261.0 lb

## 2012-11-23 DIAGNOSIS — D693 Immune thrombocytopenic purpura: Secondary | ICD-10-CM

## 2012-11-23 LAB — CBC WITH DIFFERENTIAL (CANCER CENTER ONLY)
Eosinophils Absolute: 0.1 10*3/uL (ref 0.0–0.5)
HCT: 45.8 % (ref 38.7–49.9)
LYMPH#: 2.3 10*3/uL (ref 0.9–3.3)
LYMPH%: 41.8 % (ref 14.0–48.0)
MCV: 89 fL (ref 82–98)
MONO#: 0.5 10*3/uL (ref 0.1–0.9)
Platelets: 62 10*3/uL — ABNORMAL LOW (ref 145–400)
RBC: 5.17 10*6/uL (ref 4.20–5.70)
WBC: 5.6 10*3/uL (ref 4.0–10.0)

## 2012-11-23 LAB — CHCC SATELLITE - SMEAR

## 2012-11-23 NOTE — Progress Notes (Signed)
CC:   Stacie [place]. Cliffton Asters, M.D.  DIAGNOSIS:  Chronic immune thrombocytopenia.  CURRENT THERAPY:  Observation.  INTERIM HISTORY:  Mr. Compean comes in for his yearly followup.  He is doing okay.  He has had no problems since we last saw him a year ago. He says he has changed his diet.  He is eating all organic now.  He says he feels so much better doing this.  He is trying to watch his weight.  His weight is up a little bit since we last saw him.  He has had no bleeding or bruising.  He has had no nausea or vomiting. He has had no cough or shortness breath.  There has been no change in medications.  He still takes aspirin, which I still think is okay.  Despite the thrombocytopenia, I think his risk of bleeding is very, very little.  PHYSICAL EXAMINATION:  General:  This is a somewhat obese white gentleman in no obvious distress.  Vital signs:  Temperature of 98.1, pulse 70, respiratory rate 18, blood pressure 106/71.  Weight is 261. Head and neck:  Normocephalic, atraumatic skull.  There are no ocular or oral lesions.  There are no palpable cervical or supraclavicular lymph nodes.  Lungs:  Clear bilaterally.  Cardiac:  Regular rate and rhythm with a normal S1 and S2.  There are no murmurs, rubs, or bruits. Abdomen:  Soft with good bowel sounds.  There is no palpable abdominal mass.  There is no fluid wave.  There is no palpable hepatosplenomegaly. Back:  No tenderness over the spine, ribs, or hips.  Extremities:  No clubbing, cyanosis, or edema.  Neurological:  No focal neurological deficits.  Skin:  No rashes, ecchymoses, or petechiae.  LABORATORY STUDIES:  White cell count is 5.6, hemoglobin 17.1, hematocrit 45.8, platelet count 62,000.  MCV is 89.  Peripheral smear shows a normochromic, normocytic population of red blood cells.  There are no nucleated red blood cells.  There are no teardrop cells.  I see no schistocytes.  There is no rouleaux formation. White cells appear  normal in morphology and maturation.  There are no atypical lymphocytes.  I see no blasts.  There are no hypersegmented polys.  Platelets are decreased in number.  Platelets are well- granulated.  He has large platelets.  IMPRESSION:  Mr. Scrima is a 62 year old gentleman with thrombocytopenia.  We have been following him now for 4 years.  He has done well.  He had a bone marrow biopsy back in 2010.  Again, I think his platelet counts will "fluctuate" within a range.  As long as they do this, he will be okay.  I do not see any problem with him taking aspirin.  We will see him back in 1 more year.    ______________________________ Josph Macho, M.D. PRE/MEDQ  D:  11/23/2012  T:  11/23/2012  Job:  [id number]

## 2012-11-23 NOTE — Progress Notes (Signed)
This office note has been dictated.

## 2013-11-22 ENCOUNTER — Encounter: Payer: Self-pay | Admitting: Hematology & Oncology

## 2013-11-22 ENCOUNTER — Other Ambulatory Visit (HOSPITAL_BASED_OUTPATIENT_CLINIC_OR_DEPARTMENT_OTHER): Payer: Managed Care, Other (non HMO) | Admitting: Lab

## 2013-11-22 ENCOUNTER — Ambulatory Visit (HOSPITAL_BASED_OUTPATIENT_CLINIC_OR_DEPARTMENT_OTHER): Payer: Managed Care, Other (non HMO) | Admitting: Hematology & Oncology

## 2013-11-22 VITALS — BP 119/75 | HR 74 | Temp 97.5°F | Resp 18 | Ht 70.0 in | Wt 257.0 lb

## 2013-11-22 DIAGNOSIS — D696 Thrombocytopenia, unspecified: Secondary | ICD-10-CM

## 2013-11-22 DIAGNOSIS — D693 Immune thrombocytopenic purpura: Secondary | ICD-10-CM

## 2013-11-22 LAB — CBC WITH DIFFERENTIAL (CANCER CENTER ONLY)
BASO#: 0 10*3/uL (ref 0.0–0.2)
BASO%: 0.6 % (ref 0.0–2.0)
EOS%: 1.3 % (ref 0.0–7.0)
Eosinophils Absolute: 0.1 10*3/uL (ref 0.0–0.5)
HCT: 48.8 % (ref 38.7–49.9)
HGB: 17.7 g/dL — ABNORMAL HIGH (ref 13.0–17.1)
LYMPH#: 2.4 10*3/uL (ref 0.9–3.3)
LYMPH%: 33.1 % (ref 14.0–48.0)
MCH: 32.1 pg (ref 28.0–33.4)
MCHC: 36.3 g/dL — ABNORMAL HIGH (ref 32.0–35.9)
MCV: 89 fL (ref 82–98)
MONO#: 0.5 10*3/uL (ref 0.1–0.9)
MONO%: 7.5 % (ref 0.0–13.0)
NEUT%: 57.5 % (ref 40.0–80.0)
NEUTROS ABS: 4.1 10*3/uL (ref 1.5–6.5)
PLATELETS: 63 10*3/uL — AB (ref 145–400)
RBC: 5.51 10*6/uL (ref 4.20–5.70)
RDW: 13 % (ref 11.1–15.7)
WBC: 7.1 10*3/uL (ref 4.0–10.0)

## 2013-11-22 LAB — CHCC SATELLITE - SMEAR

## 2013-11-22 NOTE — Progress Notes (Signed)
   Diagnoses: Chronic immune thrombocytopenia  Current therapy:  Observation  Interval history:  [name] comes in for his yearly followup. Since we last saw him, and he said no problems with bleeding or bruising. He's had no nausea vomiting. He's had no cough. He's had no change in bowel or bladder habits. He said that he did have an infection in the bladder. He was seen by urology for this and given antibiotics.  He's had no fatigue or weakness. There's been no rashes. He's had no change in medications.  Physical exam:  Somewhat obese white gentleman in no obvious distress. Vital signs show temperature of 97.5. Pulse 74. Blood pressure 119/75. Weight 257 pounds.   Head and neck exam shows no ocular or oral lesions. There is no palpable cervical or supraclavicular lymph nodes. Lungs are clear. Cardiac exam regular in rhythm with no murmurs rubs or bruits. Abdomen is soft. He is mildly obese. There is no fluid wave. There is no palpable hepato- splenomegaly. Back exam no tenderness over the spine ribs or hips. Extremities shows no clubbing cyanosis or edema. Has good range of motion of his joints. Skin exam no rashes ecchymoses or petechia. Neurological exam no focal neurological deficits.  Labs:  White cell count is 7.1. Hemoglobin 17.7. Platelet count 63,000.  Blood smear: normochromic normocytic red blood cells. There are no nucleated red cells. There are no teardrop cells. I see no rouleau formation. There are no schistocytes. White cells are normal in morphology maturation. There is no immature myeloid or lymphoid forms. There are no hypersegmented polys. Platelets are adequate in in size. platelets are decreased. He has several large platelets.they are will granulated.  Impression:  [name] this 63 year old gentleman. History of mild chronic thrombocytopenia. His platelet count has been stable for the past 2 years.  At this point, I don't think that we will need to get him back.his  blood smear is unremarkable. I don't find anything on his physical exam.  Am sure that his platelet count will continue to fluctuate. At the current level, he should have absolutely no problems with bleeding. He is on baby aspirin which I would keep him on.  We will be more than happy to see him back at any time.

## 2014-08-21 ENCOUNTER — Other Ambulatory Visit: Payer: Self-pay | Admitting: *Deleted

## 2014-08-21 DIAGNOSIS — D693 Immune thrombocytopenic purpura: Secondary | ICD-10-CM

## 2014-08-26 ENCOUNTER — Other Ambulatory Visit (HOSPITAL_BASED_OUTPATIENT_CLINIC_OR_DEPARTMENT_OTHER): Payer: Managed Care, Other (non HMO) | Admitting: Lab

## 2014-08-26 DIAGNOSIS — D693 Immune thrombocytopenic purpura: Secondary | ICD-10-CM

## 2014-08-26 LAB — CBC WITH DIFFERENTIAL (CANCER CENTER ONLY)
BASO#: 0 10*3/uL (ref 0.0–0.2)
BASO%: 0.5 % (ref 0.0–2.0)
EOS%: 1.8 % (ref 0.0–7.0)
Eosinophils Absolute: 0.1 10*3/uL (ref 0.0–0.5)
HCT: 47.4 % (ref 38.7–49.9)
HGB: 17.1 g/dL (ref 13.0–17.1)
LYMPH#: 2.5 10*3/uL (ref 0.9–3.3)
LYMPH%: 31.8 % (ref 14.0–48.0)
MCH: 31.8 pg (ref 28.0–33.4)
MCHC: 36.1 g/dL — AB (ref 32.0–35.9)
MCV: 88 fL (ref 82–98)
MONO#: 0.6 10*3/uL (ref 0.1–0.9)
MONO%: 7.8 % (ref 0.0–13.0)
NEUT%: 58.1 % (ref 40.0–80.0)
NEUTROS ABS: 4.6 10*3/uL (ref 1.5–6.5)
PLATELETS: 87 10*3/uL — AB (ref 145–400)
RBC: 5.38 10*6/uL (ref 4.20–5.70)
RDW: 13.1 % (ref 11.1–15.7)
WBC: 7.8 10*3/uL (ref 4.0–10.0)

## 2015-10-04 HISTORY — PX: TRANSURETHRAL RESECTION OF PROSTATE: SHX73

## 2015-10-25 ENCOUNTER — Encounter (HOSPITAL_COMMUNITY): Payer: Self-pay | Admitting: Emergency Medicine

## 2015-10-25 ENCOUNTER — Emergency Department (HOSPITAL_COMMUNITY)
Admission: EM | Admit: 2015-10-25 | Discharge: 2015-10-25 | Disposition: A | Discharge status: Home / Self Care | Payer: Managed Care, Other (non HMO) | Attending: Emergency Medicine | Admitting: Emergency Medicine

## 2015-10-25 DIAGNOSIS — Z7982 Long term (current) use of aspirin: Secondary | ICD-10-CM | POA: Insufficient documentation

## 2015-10-25 DIAGNOSIS — Z792 Long term (current) use of antibiotics: Secondary | ICD-10-CM | POA: Insufficient documentation

## 2015-10-25 DIAGNOSIS — T83091A Other mechanical complication of indwelling urethral catheter, initial encounter: Secondary | ICD-10-CM | POA: Insufficient documentation

## 2015-10-25 DIAGNOSIS — R1030 Lower abdominal pain, unspecified: Secondary | ICD-10-CM | POA: Insufficient documentation

## 2015-10-25 DIAGNOSIS — R339 Retention of urine, unspecified: Secondary | ICD-10-CM

## 2015-10-25 DIAGNOSIS — Z79899 Other long term (current) drug therapy: Secondary | ICD-10-CM | POA: Insufficient documentation

## 2015-10-25 DIAGNOSIS — Y658 Other specified misadventures during surgical and medical care: Secondary | ICD-10-CM | POA: Insufficient documentation

## 2015-10-25 DIAGNOSIS — T839XXA Unspecified complication of genitourinary prosthetic device, implant and graft, initial encounter: Secondary | ICD-10-CM

## 2015-10-25 LAB — URINALYSIS, ROUTINE W REFLEX MICROSCOPIC
Bilirubin Urine: NEGATIVE
Glucose, UA: NEGATIVE mg/dL
Ketones, ur: NEGATIVE mg/dL
Leukocytes, UA: NEGATIVE
Nitrite: NEGATIVE
PH: 5 (ref 5.0–8.0)
PROTEIN: NEGATIVE mg/dL
SPECIFIC GRAVITY, URINE: 1.014 (ref 1.005–1.030)

## 2015-10-25 LAB — URINE MICROSCOPIC-ADD ON: SQUAMOUS EPITHELIAL / LPF: NONE SEEN

## 2015-10-25 NOTE — Discharge Instructions (Signed)
It was our pleasure to provide your ER care today - we hope that you feel better.  Drink plenty of fluids.  Continue flomax.   Follow up with your urologist tomorrow as planned.  Try your naprosyn as need.  Avoid hydrocodone if possible.   Return to ER if worse, new symptoms, fevers, severe pain, catheter not draining, other concern.    Acute Urinary Retention, Male Acute urinary retention is the temporary inability to urinate. This is a common problem in older men. As men age their prostates become larger and block the flow of urine from the bladder. This is usually a problem that has come on gradually.  HOME CARE INSTRUCTIONS If you are sent home with a Foley catheter and a drainage system, you will need to discuss the best course of action with your health care provider. While the catheter is in, maintain a good intake of fluids. Keep the drainage bag emptied and lower than your catheter. This is so that contaminated urine will not flow back into your bladder, which could lead to a urinary tract infection. There are two main types of drainage bags. One is a large bag that usually is used at night. It has a good capacity that will allow you to sleep through the night without having to empty it. The second type is called a leg bag. It has a smaller capacity, so it needs to be emptied more frequently. However, the main advantage is that it can be attached by a leg strap and can go underneath your clothing, allowing you the freedom to move about or leave your home. Only take over-the-counter or prescription medicines for pain, discomfort, or fever as directed by your health care provider.  SEEK MEDICAL CARE IF:  You develop a low-grade fever.  You experience spasms or leakage of urine with the spasms. SEEK IMMEDIATE MEDICAL CARE IF:  1. You develop chills or fever. 2. Your catheter stops draining urine. 3. Your catheter falls out. 4. You start to develop increased bleeding that does not  respond to rest and increased fluid intake. MAKE SURE YOU: 1. Understand these instructions. 2. Will watch your condition. 3. Will get help right away if you are not doing well or get worse.   This information is not intended to replace advice given to you by your health care provider. Make sure you discuss any questions you have with your health care provider.   Document Released: 12/26/2000 Document Revised: 02/03/2015 Document Reviewed: 02/28/2013 Elsevier Interactive Patient Education 2016 [employer]  Foley Catheter Care, Adult A Foley catheter is a soft, flexible tube that is placed into the bladder to drain urine. A Foley catheter may be inserted if:  You leak urine or are not able to control when you urinate (urinary incontinence).  You are not able to urinate when you need to (urinary retention).  You had prostate surgery or surgery on the genitals.  You have certain medical conditions, such as multiple sclerosis, dementia, or a spinal cord injury. If you are going home with a Foley catheter in place, follow the instructions below. TAKING CARE OF THE CATHETER 5. Wash your hands with soap and water. 6. Using mild soap and warm water on a clean washcloth:  Clean the area on your body closest to the catheter insertion site using a circular motion, moving away from the catheter. Never wipe toward the catheter because this could sweep bacteria up into the urethra and cause infection.  Remove all traces of  soap. Pat the area dry with a clean towel. For males, reposition the foreskin. 7. Attach the catheter to your leg so there is no tension on the catheter. Use adhesive tape or a leg strap. If you are using adhesive tape, remove any sticky residue left behind by the previous tape you used. 8. Keep the drainage bag below the level of the bladder, but keep it off the floor. 9. Check throughout the day to be sure the catheter is working and urine is draining freely. Make sure the  tubing does not become kinked. 10. Do not pull on the catheter or try to remove it. Pulling could damage internal tissues. TAKING CARE OF THE DRAINAGE BAGS You will be given two drainage bags to take home. One is a large overnight drainage bag, and the other is a smaller leg bag that fits underneath clothing. You may wear the overnight bag at any time, but you should never wear the smaller leg bag at night. Follow the instructions below for how to empty, change, and clean your drainage bags. Emptying the Drainage Bag You must empty your drainage bag when it is  - full or at least 2-3 times a day. 4. Wash your hands with soap and water. 5. Keep the drainage bag below your hips, below the level of your bladder. This stops urine from going back into the tubing and into your bladder. 6. Hold the dirty bag over the toilet or a clean container. 7. Open the pour spout at the bottom of the bag and empty the urine into the toilet or container. Do not let the pour spout touch the toilet, container, or any other surface. Doing so can place bacteria on the bag, which can cause an infection. 8. Clean the pour spout with a gauze pad or cotton ball that has rubbing alcohol on it. 9. Close the pour spout. 10. Attach the bag to your leg with adhesive tape or a leg strap. 11. Wash your hands well. Changing the Drainage Bag Change your drainage bag once a month or sooner if it starts to smell bad or look dirty. Below are steps to follow when changing the drainage bag. 1. Wash your hands with soap and water. 2. Pinch off the rubber catheter so that urine does not spill out. 3. Disconnect the catheter tube from the drainage tube at the connection valve. Do not let the tubes touch any surface. 4. Clean the end of the catheter tube with an alcohol wipe. Use a different alcohol wipe to clean the end of the drainage tube. 5. Connect the catheter tube to the drainage tube of the clean drainage bag. 6. Attach the new bag  to the leg with adhesive tape or a leg strap. Avoid attaching the new bag too tightly. 7. Wash your hands well. Cleaning the Drainage Bag 1. Wash your hands with soap and water. 2. Wash the bag in warm, soapy water. 3. Rinse the bag thoroughly with warm water. 4. Fill the bag with a solution of white vinegar and water (1 cup vinegar to 1 qt warm water [.2 L vinegar to 1 L warm water]). Close the bag and soak it for 30 minutes in the solution. 5. Rinse the bag with warm water. 6. Hang the bag to dry with the pour spout open and hanging downward. 7. Store the clean bag (once it is dry) in a clean plastic bag. 8. Wash your hands well. PREVENTING INFECTION  Wash your hands before and after  handling your catheter.  Take showers daily and wash the area where the catheter enters your body. Do not take baths. Replace wet leg straps with dry ones, if this applies.  Do not use powders, sprays, or lotions on the genital area. Only use creams, lotions, or ointments as directed by your caregiver.  For females, wipe from front to back after each bowel movement.  Drink enough fluids to keep your urine clear or pale yellow unless you have a fluid restriction.  Do not let the drainage bag or tubing touch or lie on the floor.  Wear cotton underwear to absorb moisture and to keep your skin drier. SEEK MEDICAL CARE IF:   Your urine is cloudy or smells unusually bad.  Your catheter becomes clogged.  You are not draining urine into the bag or your bladder feels full.  Your catheter starts to leak. SEEK IMMEDIATE MEDICAL CARE IF:   You have pain, swelling, redness, or pus where the catheter enters the body.  You have pain in the abdomen, legs, lower back, or bladder.  You have a fever.  You see blood fill the catheter, or your urine is pink or red.  You have nausea, vomiting, or chills.  Your catheter gets pulled out. MAKE SURE YOU:   Understand these instructions.  Will watch your  condition.  Will get help right away if you are not doing well or get worse.   This information is not intended to replace advice given to you by your health care provider. Make sure you discuss any questions you have with your health care provider.   Document Released: 09/19/2005 Document Revised: 02/03/2014 Document Reviewed: 09/10/2012 Elsevier Interactive Patient Education [employer]

## 2015-10-25 NOTE — ED Notes (Signed)
Pt is in stable condition upon d/c and ambulates from ED. 

## 2015-10-25 NOTE — ED Provider Notes (Signed)
CSN: [id number]     Arrival date & time 10/25/15  1320 History   First MD Initiated Contact with Patient 10/25/15 1507     Chief Complaint  Patient presents with  . Urinary Retention     (Consider location/radiation/quality/duration/timing/severity/associated sxs/prior Treatment) The history is provided by the patient and the spouse.  Patient w hx enlarged prostate, c/o recurrent/persistent problems w urine retention x 1 month. Has had foley in since then, last changed 1 week ago.   Has appt with urologist tomorrow to try foley removal.  Since last pm, very little urine draining into bag, and increased suprapubic area pain. Constant. Dull. Burning. Non radiating. No fever or chills. No nv. Does not feel sick or ill. No trauma to area. No hematuria.       Past Medical History  Diagnosis Date  . 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 No pertinent past surgical history. No family history on file. Social History  Substance Use Topics  . Smoking status: Never Smoker   . Smokeless tobacco: Never Used     Comment: never used product  . Alcohol Use: No    Review of Systems  Constitutional: Negative for fever and chills.  Cardiovascular: Negative for leg swelling.  Gastrointestinal: Positive for abdominal pain. Negative for vomiting and diarrhea.  Genitourinary: Negative for dysuria.  Skin: Negative for rash.      Allergies  Prednisone  Home Medications   Prior to Admission medications   Medication Sig Start Date End Date Taking? Authorizing Provider  aspirin 325 MG buffered tablet Take 325 mg by mouth daily.   Yes Historical Provider, MD  fish oil-omega-3 fatty acids 1000 MG capsule Take 2 g by mouth daily.   Yes Historical Provider, MD  HYDROcodone-acetaminophen (NORCO) 10-325 MG tablet Take 1 tablet by mouth every 6 (six) hours as needed for severe pain.  10/09/15  Yes Historical Provider, MD  lisinopril-hydrochlorothiazide  (PRINZIDE,ZESTORETIC) 20-25 MG per tablet Take 1 tablet by mouth daily.  10/27/12  Yes Historical Provider, MD  Multiple Vitamin (MULTIVITAMIN) capsule Take 1 capsule by mouth daily.   Yes Historical Provider, MD  pravastatin (PRAVACHOL) 40 MG tablet Take 40 mg by mouth daily.   Yes Historical Provider, MD  tamsulosin (FLOMAX) 0.4 MG CAPS capsule Take 0.4 mg by mouth 2 (two) times daily. 10/06/15  Yes Historical Provider, MD  zolpidem (AMBIEN) 10 MG tablet Take 10 mg by mouth as needed for sleep.  11/22/12  Yes Historical Provider, MD   BP 156/88 mmHg  Pulse 125  Temp(Src) 97.5 F (36.4 C)  Resp 24  Ht 6' (1.829 m)  Wt 116.121 kg  BMI 34.71 kg/m2  SpO2 95% Physical Exam  Constitutional: He is oriented to person, place, and time. He appears well-developed and well-nourished. No distress.  Eyes: Conjunctivae are normal. No scleral icterus.  Neck: Neck supple. No tracheal deviation present.  Cardiovascular: Normal rate.   Pulmonary/Chest: Effort normal. No accessory muscle usage. No respiratory distress.  Abdominal: Soft. Bowel sounds are normal. He exhibits no distension and no mass. There is no tenderness. There is no rebound and no guarding.  Genitourinary:  Normal ext genitalia. Foley in place, draining clear, yellow urine.   Musculoskeletal: Normal range of motion. He exhibits no edema.  Neurological: He is alert and oriented to person, place, and time.  Skin: Skin is warm and dry. No rash noted. He is not diaphoretic.  Psychiatric: He has a normal mood and affect.  Nursing note  and vitals reviewed.   ED Course  Procedures (including critical care time) Labs Review I have personally reviewed and evaluated these lab results as part of my medical decision-making.    MDM   Foley irrigated by staff with drainage of 800 cc urine.  Catheter/urine flowing.  Symptoms completely resolved.   No pain.   Hr normal.   Pt has f/u with his urologist tomorrow.  Pt currently appears  stable for d/c.      Cathren Laine, MD 10/25/15 [phone]

## 2015-10-25 NOTE — ED Notes (Signed)
Pt reports that he has a foley in place last emptied the bag about 1am. At 5 am pt began having burning pain with urination and decreased output since.

## 2015-10-27 DIAGNOSIS — N401 Enlarged prostate with lower urinary tract symptoms: Secondary | ICD-10-CM

## 2015-10-27 HISTORY — DX: Benign prostatic hyperplasia with lower urinary tract symptoms: N40.1

## 2015-11-17 DIAGNOSIS — R339 Retention of urine, unspecified: Secondary | ICD-10-CM

## 2015-11-17 HISTORY — DX: Retention of urine, unspecified: R33.9

## 2015-11-20 ENCOUNTER — Ambulatory Visit (HOSPITAL_BASED_OUTPATIENT_CLINIC_OR_DEPARTMENT_OTHER): Payer: 59 | Admitting: Family

## 2015-11-20 ENCOUNTER — Encounter: Payer: Self-pay | Admitting: Family

## 2015-11-20 ENCOUNTER — Other Ambulatory Visit: Payer: Self-pay | Admitting: Family

## 2015-11-20 ENCOUNTER — Other Ambulatory Visit (HOSPITAL_BASED_OUTPATIENT_CLINIC_OR_DEPARTMENT_OTHER): Payer: 59

## 2015-11-20 ENCOUNTER — Ambulatory Visit: Payer: 59

## 2015-11-20 VITALS — BP 129/86 | HR 76 | Temp 97.9°F | Resp 18 | Ht 72.0 in | Wt 261.0 lb

## 2015-11-20 DIAGNOSIS — D693 Immune thrombocytopenic purpura: Secondary | ICD-10-CM | POA: Diagnosis not present

## 2015-11-20 LAB — CBC WITH DIFFERENTIAL (CANCER CENTER ONLY)
BASO#: 0 10*3/uL (ref 0.0–0.2)
BASO%: 0.4 % (ref 0.0–2.0)
EOS%: 1.7 % (ref 0.0–7.0)
Eosinophils Absolute: 0.1 10*3/uL (ref 0.0–0.5)
HCT: 44.3 % (ref 38.7–49.9)
HGB: 15.8 g/dL (ref 13.0–17.1)
LYMPH#: 2.2 10*3/uL (ref 0.9–3.3)
LYMPH%: 28.7 % (ref 14.0–48.0)
MCH: 30.3 pg (ref 28.0–33.4)
MCHC: 35.7 g/dL (ref 32.0–35.9)
MCV: 85 fL (ref 82–98)
MONO#: 0.6 10*3/uL (ref 0.1–0.9)
MONO%: 7.3 % (ref 0.0–13.0)
NEUT#: 4.8 10*3/uL (ref 1.5–6.5)
NEUT%: 61.9 % (ref 40.0–80.0)
RBC: 5.21 10*6/uL (ref 4.20–5.70)
RDW: 13.3 % (ref 11.1–15.7)
WBC: 7.7 10*3/uL (ref 4.0–10.0)

## 2015-11-20 NOTE — Progress Notes (Signed)
Hematology/Oncology Consultation   Name: Eric Mckenzie      MRN: [id number]    Location: Room/bed info not found  Date: 11/20/2015 Time:11:20 AM   REFERRING PHYSICIAN: Delorse Limber, MD - Urology  REASON FOR CONSULT: History of ITP needs clearance for TURP   DIAGNOSIS: ITP  HISTORY OF PRESENT ILLNESS: Eric Mckenzie is a very pleasant 65 yo white male with a history of mild ITP. He has been followed by Dr. Myna Hidalgo in the past. He recently went to the ED after being unable to urinate after a long flight to [place]. He was found to have BPH and urinary retention. He currently has a catheter in place and is taking Proscar and Flomax daily. Attempts were made to remove the catheter by urology but ultimately he required it stay in place. Urology plans to do a TURP.  He denies having any episodes of bleeding, bruising or petechiae. His platelet count today is 125 and Hgb is 15.8.  He states that he has had surgery in the past for a bowel obstruction where he required a colon resection. He had no issues with bleeding during or after this procedure.  He also states that he had a recent abdominal scan which showed no organomegaly.  No family history of ITP, liver or spleen issues.   He did recently have a catheter associated UTI and was treated with Cipro. He states that his symptoms have resolved.  No fever, chills, n/v, cough, rash, dizziness, headache, vision changes, SOB, chest pain, palpitations, abdominal pain or changes in bowel habits.  No lymphadenopathy found on exam.  No swelling, tenderness, numbness or tingling in his extremities.  He is not diabetic and has no problems with his thyroid.  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  He has maintained a good appetite and is staying well hydrated.   ROS: All other 10 point review of systems is negative.   PAST MEDICAL HISTORY:   Past Medical History  Diagnosis Date  . ITP (idiopathic  thrombocytopenic purpura) 11/18/2011    ALLERGIES: Allergies  Allergen Reactions  . Prednisone Other (See Comments)    Very bad mood swings      MEDICATIONS:  Current Outpatient Prescriptions on File Prior to Visit  Medication Sig Dispense Refill  . aspirin 325 MG buffered tablet Take 325 mg by mouth daily.    . fish oil-omega-3 fatty acids 1000 MG capsule Take 2 g by mouth daily.    Marland Kitchen HYDROcodone-acetaminophen (NORCO) 10-325 MG tablet Take 1 tablet by mouth every 6 (six) hours as needed for severe pain.   0  . lisinopril-hydrochlorothiazide (PRINZIDE,ZESTORETIC) 20-25 MG per tablet Take 1 tablet by mouth daily.     . Multiple Vitamin (MULTIVITAMIN) capsule Take 1 capsule by mouth daily.    . pravastatin (PRAVACHOL) 40 MG tablet Take 40 mg by mouth daily.    . tamsulosin (FLOMAX) 0.4 MG CAPS capsule Take 0.4 mg by mouth 2 (two) times daily.  11  . zolpidem (AMBIEN) 10 MG tablet Take 10 mg by mouth as needed for sleep.      No current facility-administered medications on file prior to visit.     PAST SURGICAL HISTORY No past surgical history on file.  FAMILY HISTORY: No family history on file.  SOCIAL HISTORY:  reports that he has never smoked. He has never used smokeless tobacco. He reports that he does not drink alcohol or use illicit drugs.  PERFORMANCE STATUS:  The patient's performance status is 0 - Asymptomatic  PHYSICAL EXAM: Most Recent Vital Signs: There were no vitals taken for this visit. BP 129/86 mmHg  Pulse 76  Temp(Src) 97.9 F (36.6 C) (Oral)  Resp 18  Ht 6' (1.829 m)  Wt 261 lb (118.389 kg)  BMI 35.39 kg/m2  General Appearance:    Alert, cooperative, no distress, appears stated age  Head:    Normocephalic, without obvious abnormality, atraumatic  Eyes:    PERRL, conjunctiva/corneas clear, EOM's intact, fundi    benign, both eyes             Throat:   Lips, mucosa, and tongue normal; teeth and gums normal  Neck:   Supple, symmetrical, trachea  midline, no adenopathy;       thyroid:  No enlargement/tenderness/nodules; no carotid   bruit or JVD  Back:     Symmetric, no curvature, ROM normal, no CVA tenderness  Lungs:     Clear to auscultation bilaterally, respirations unlabored  Chest wall:    No tenderness or deformity  Heart:    Regular rate and rhythm, S1 and S2 normal, no murmur, rub   or gallop  Abdomen:     Soft, non-tender, bowel sounds active all four quadrants,    no masses, no organomegaly        Extremities:   Extremities normal, atraumatic, no cyanosis or edema  Pulses:   2+ and symmetric all extremities  Skin:   Skin color, texture, turgor normal, no rashes or lesions  Lymph nodes:   Cervical, supraclavicular, and axillary nodes normal  Neurologic:   CNII-XII intact. Normal strength, sensation and reflexes      throughout   LABORATORY DATA:  Results for orders placed or performed in visit on 11/20/15 (from the past 48 hour(s))  CBC w/Diff     Status: Abnormal   Collection Time: 11/20/15 10:51 AM  Result Value Ref Range   WBC 7.7 4.0 - 10.0 10e3/uL   RBC 5.21 4.20 - 5.70 10e6/uL   HGB 15.8 13.0 - 17.1 g/dL   HCT 16.1 09.6 - 04.5 %   MCV 85 82 - 98 fL   MCH 30.3 28.0 - 33.4 pg   MCHC 35.7 32.0 - 35.9 g/dL   RDW 40.9 81.1 - 91.4 %   Platelets 125 Platelet count consistent in citrate (L) 145 - 400 10e3/uL   NEUT# 4.8 1.5 - 6.5 10e3/uL   LYMPH# 2.2 0.9 - 3.3 10e3/uL   MONO# 0.6 0.1 - 0.9 10e3/uL   Eosinophils Absolute 0.1 0.0 - 0.5 10e3/uL   BASO# 0.0 0.0 - 0.2 10e3/uL   NEUT% 61.9 40.0 - 80.0 %   LYMPH% 28.7 14.0 - 48.0 %   MONO% 7.3 0.0 - 13.0 %   EOS% 1.7 0.0 - 7.0 %   BASO% 0.4 0.0 - 2.0 %      RADIOGRAPHY: No results found.     PATHOLOGY: None  ASSESSMENT/PLAN: Eric Mckenzie is a very pleasant 65 yo white male with a history of mild ITP. He was recently diagnosed with BPH and urinary retention. Urology plans to do a TURP but would like clearance from hematology before proceeding. His platelet  count today is 125 and he has had no issues with bleeding. He has also had a colon resection in the past without complication. No anemia.  We will see what his Von Willebrand panel and PTT results show.  From our standpoint there does not seem to  be any issue that would prevent him from having the TURP done.  All questions were answered. We will only follow-up with him as needed at this point. He will contact us with any hematologic needs in the future. He is stable at this time.    The patient was discussed with and also seen by Dr. Myna Hidalgo and he is in agreement with the aforementioned.   [facility] M     Addendum:   I saw and examined the patient with Kristoff Coonradt. His platelet count is good today area and I'll adjust blood smear. I did not see anything on his blood smear that looked suspicious.  He has minimal ITP. His risk of bleeding should be very, very low. I do not think that anything special needs to be done with respect to his upcoming surgery.  I told him not to take any aspirin or any kind of nonsteroidal medications right now.  I don't think we have to see him back.  We spent about 30 minutes or so with him. It was nice to see him again.   Hewitt Shorts

## 2015-11-21 LAB — APTT: aPTT: 27 s (ref 24–33)

## 2015-11-23 LAB — VON WILLEBRAND PANEL
Factor VIII Activity: 164 % — ABNORMAL HIGH (ref 57–163)
PDF IMAGE: 0
vWF Activity: 136 % (ref 50–200)
von Willebrand Factor (vWF) Ag: 194 % (ref 50–200)

## 2016-10-03 HISTORY — PX: HERNIA REPAIR: SHX51

## 2017-04-21 DIAGNOSIS — K56609 Unspecified intestinal obstruction, unspecified as to partial versus complete obstruction: Secondary | ICD-10-CM | POA: Insufficient documentation

## 2017-04-21 HISTORY — DX: Unspecified intestinal obstruction, unspecified as to partial versus complete obstruction: K56.609

## 2017-05-11 DIAGNOSIS — K439 Ventral hernia without obstruction or gangrene: Secondary | ICD-10-CM

## 2017-05-11 HISTORY — DX: Ventral hernia without obstruction or gangrene: K43.9

## 2018-02-20 ENCOUNTER — Ambulatory Visit: Payer: No Typology Code available for payment source | Admitting: Urology

## 2018-02-20 ENCOUNTER — Encounter: Payer: Self-pay | Admitting: Urology

## 2018-02-20 VITALS — BP 146/95 | HR 69 | Ht 72.0 in | Wt 258.0 lb

## 2018-02-20 DIAGNOSIS — N400 Enlarged prostate without lower urinary tract symptoms: Secondary | ICD-10-CM | POA: Diagnosis not present

## 2018-02-20 DIAGNOSIS — R972 Elevated prostate specific antigen [PSA]: Secondary | ICD-10-CM

## 2018-02-20 NOTE — Progress Notes (Signed)
02/20/2018 9:59 AM   [name] 1951/10/03 [id number]  Referring provider: Bjorn Pippin, MD [address] [place], [place] [address]  Chief Complaint  Patient presents with  . Elevated PSA    New Patient  . Benign Prostatic Hypertrophy    HPI: 67 year old male with a history of BPH who presents today to establish care.  He is followed up with by Dr. Heron Nay first [facility] prior to that.  He has a personal history of massive urinary retention dating back to 2016.  He had another episode in 2017.  He underwent urodynamics consistent with outlet obstruction.  He was taken to the operating room on 3/17 for TURP.  All BPH medications were stopped postoperatively.  He also has a personal history of elevated PSAs.  He reports that his baseline is usually around the 5-70 range.  He had a negative prostate biopsy greater than 15 years ago.    Most recent documented PSA 6.92 03/2017.  Repeat PSA down to 5.66 on 07/2017.  Today, he denies any significant urinary symptoms.  He does occasionally get up at night to pee if he has an organic just before bed.  Otherwise, he empties his bladder, has a decent stream, without urgency frequency or incontinence.  No dysuria or gross hematuria.  He is not currently on any BPH medications.   PMH: Past Medical History:  Diagnosis Date  . Enlarged prostate with lower urinary tract symptoms (LUTS) 10/27/2015  . 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 SBO (small bowel obstruction) (HCC) 04/21/2017  . Urinary retention 11/17/2015  . Ventral hernia without obstruction or gangrene 05/11/2017   Added automatically from request for surgery [id number]    Surgical History: Past Surgical History:  Procedure Laterality Date  . HERNIA REPAIR  2018  . TRANSURETHRAL RESECTION OF PROSTATE  2017    Home Medications:  Allergies as of 02/20/2018      Reactions   Prednisone Other (See Comments)   Very bad mood swings        Medication List        Accurate as of 02/20/18  9:59 AM. Always use your most recent med list.          aspirin 325 MG buffered tablet Take 325 mg by mouth daily.   fish oil-omega-3 fatty acids 1000 MG capsule Take 2 g by mouth daily.   lisinopril-hydrochlorothiazide 10-12.5 MG tablet Commonly known as:  PRINZIDE,ZESTORETIC TK 1 T PO D   multivitamin capsule Take 1 capsule by mouth daily.   pravastatin 40 MG tablet Commonly known as:  PRAVACHOL Take 40 mg by mouth daily.   tamsulosin 0.4 MG Caps capsule Commonly known as:  FLOMAX Take 0.4 mg by mouth 2 (two) times daily.   zolpidem 10 MG tablet Commonly known as:  AMBIEN Take 10 mg by mouth as needed for sleep.       Allergies:  Allergies  Allergen Reactions  . Prednisone Other (See Comments)    Very bad mood swings    Family History: Family History  Problem Relation Age of Onset  . Prostate cancer Neg Hx   . Kidney cancer Neg Hx     Social History:  reports that he has never smoked. He has never used smokeless tobacco. He reports that he does not drink alcohol or use drugs.  ROS: UROLOGY Frequent Urination?: No Hard to postpone urination?: No Burning/pain with urination?: Yes Get up at night to urinate?: Yes Leakage of  urine?: Yes Urine stream starts and stops?: No Trouble starting stream?: No Do you have to strain to urinate?: No Blood in urine?: No Urinary tract infection?: No Sexually transmitted disease?: No Injury to kidneys or bladder?: No Painful intercourse?: No Weak stream?: No Erection problems?: Yes Penile pain?: No  Gastrointestinal Nausea?: No Vomiting?: No Indigestion/heartburn?: No Diarrhea?: No Constipation?: No  Constitutional Fever: No Night sweats?: No Weight loss?: No Fatigue?: No  Skin Skin rash/lesions?: No Itching?: No  Eyes Blurred vision?: No Double vision?: No  Ears/Nose/Throat Sore throat?: No Sinus problems?:  Yes  Hematologic/Lymphatic Swollen glands?: No Easy bruising?: No  Cardiovascular Leg swelling?: No Chest pain?: No  Respiratory Cough?: No Shortness of breath?: No  Endocrine Excessive thirst?: No  Musculoskeletal Back pain?: Yes Joint pain?: No  Neurological Headaches?: No Dizziness?: No  Psychologic Depression?: No Anxiety?: No  Physical Exam: BP (!) 146/95   Pulse 69   Ht 6' (1.829 m)   Wt 258 lb (117 kg)   BMI 34.99 kg/m   Constitutional:  Alert and oriented, No acute distress. HEENT: [place] AT, moist mucus membranes.  Trachea midline, no masses. Cardiovascular: No clubbing, cyanosis, or edema. Respiratory: Normal respiratory effort, no increased work of breathing. GI: Abdomen is soft, nontender, nondistended, obese Rectal: Normal sphincter tone, enlarged 50+ cc prostate, nontender, no nodules Skin: No rashes, bruises or suspicious lesions. Neurologic: Grossly intact, no focal deficits, moving all 4 extremities. Psychiatric: Normal mood and affect.  Laboratory Data: Lab Results  Component Value Date   WBC 7.7 11/20/2015   HGB 15.8 11/20/2015   HCT 44.3 11/20/2015   MCV 85 11/20/2015   PLT 125 Platelet count consistent in citrate (L) 11/20/2015    Lab Results  Component Value Date   CREATININE 0.93 07/13/2009    Urinalysis N/a  Pertinent Imaging: N/a  Assessment & Plan:    1. BPH without obstruction/lower urinary tract symptoms Minimal urinary symptoms status post TURP 2017 Not currently on any BPH meds  2. Elevated PSA History of fluctuating PSA in the 5 7 range Most recent PSAs Exline status post remote history of negative biopsy PSA repeated today, will call with results and follow annually if it remains within this range Rectal exam today with enlarged prostate, otherwise unremarkable - PSA   Return in about 1 year (around 02/21/2019) for IPSS/ PVR/ PSA/ DRE.  Vanna Scotland, MD  [facility] [address], [address] [place], [place] [address] [phone]

## 2018-02-21 ENCOUNTER — Telehealth: Payer: Self-pay

## 2018-02-21 LAB — PSA: PROSTATE SPECIFIC AG, SERUM: 5.8 ng/mL — AB (ref 0.0–4.0)

## 2018-02-21 NOTE — Telephone Encounter (Signed)
-----   Message from Vanna Scotland, MD sent at 02/21/2018 10:59 AM EDT ----- PSA stable.    Vanna Scotland, MD

## 2018-03-01 ENCOUNTER — Other Ambulatory Visit: Payer: Self-pay | Admitting: Family Medicine

## 2018-03-01 DIAGNOSIS — Z8249 Family history of ischemic heart disease and other diseases of the circulatory system: Secondary | ICD-10-CM

## 2018-03-14 ENCOUNTER — Other Ambulatory Visit: Payer: 59

## 2018-03-20 ENCOUNTER — Other Ambulatory Visit: Payer: Self-pay | Admitting: Family Medicine

## 2018-03-20 ENCOUNTER — Ambulatory Visit
Admission: RE | Admit: 2018-03-20 | Discharge: 2018-03-20 | Disposition: A | Discharge status: Home / Self Care | Payer: No Typology Code available for payment source | Source: Ambulatory Visit | Attending: Family Medicine | Admitting: Family Medicine

## 2018-03-20 DIAGNOSIS — Z8249 Family history of ischemic heart disease and other diseases of the circulatory system: Secondary | ICD-10-CM

## 2018-03-29 ENCOUNTER — Other Ambulatory Visit: Payer: Self-pay

## 2018-05-30 ENCOUNTER — Other Ambulatory Visit: Payer: Self-pay

## 2018-08-20 ENCOUNTER — Other Ambulatory Visit: Payer: Self-pay | Admitting: Family Medicine

## 2018-08-20 DIAGNOSIS — R319 Hematuria, unspecified: Secondary | ICD-10-CM

## 2018-08-20 DIAGNOSIS — R972 Elevated prostate specific antigen [PSA]: Secondary | ICD-10-CM

## 2018-08-21 ENCOUNTER — Other Ambulatory Visit: Payer: No Typology Code available for payment source

## 2018-08-21 DIAGNOSIS — R319 Hematuria, unspecified: Secondary | ICD-10-CM

## 2018-08-21 DIAGNOSIS — R972 Elevated prostate specific antigen [PSA]: Secondary | ICD-10-CM

## 2018-08-21 LAB — URINALYSIS, COMPLETE
BILIRUBIN UA: NEGATIVE
GLUCOSE, UA: NEGATIVE
KETONES UA: NEGATIVE
Leukocytes, UA: NEGATIVE
NITRITE UA: NEGATIVE
PROTEIN UA: NEGATIVE
SPEC GRAV UA: 1.02 (ref 1.005–1.030)
UUROB: 0.2 mg/dL (ref 0.2–1.0)
pH, UA: 5.5 (ref 5.0–7.5)

## 2018-08-21 LAB — MICROSCOPIC EXAMINATION
Bacteria, UA: NONE SEEN
Epithelial Cells (non renal): NONE SEEN /hpf (ref 0–10)
WBC UA: NONE SEEN /HPF (ref 0–5)

## 2018-08-22 LAB — PSA: PROSTATE SPECIFIC AG, SERUM: 6.1 ng/mL — AB (ref 0.0–4.0)

## 2018-08-24 LAB — CULTURE, URINE COMPREHENSIVE

## 2018-08-28 ENCOUNTER — Ambulatory Visit (INDEPENDENT_AMBULATORY_CARE_PROVIDER_SITE_OTHER): Payer: No Typology Code available for payment source | Admitting: Urology

## 2018-08-28 ENCOUNTER — Encounter: Payer: Self-pay | Admitting: Urology

## 2018-08-28 VITALS — BP 151/91 | HR 75 | Wt 271.0 lb

## 2018-08-28 DIAGNOSIS — N400 Enlarged prostate without lower urinary tract symptoms: Secondary | ICD-10-CM

## 2018-08-28 DIAGNOSIS — R310 Gross hematuria: Secondary | ICD-10-CM | POA: Diagnosis not present

## 2018-08-28 DIAGNOSIS — R972 Elevated prostate specific antigen [PSA]: Secondary | ICD-10-CM

## 2018-08-28 LAB — BLADDER SCAN AMB NON-IMAGING

## 2018-08-28 NOTE — Progress Notes (Signed)
08/28/2018 10:33 AM   [name] 1951-04-26 [id number]  Referring provider: Laurann Montana, MD [phone] [place], [place] [address]  Chief Complaint  Patient presents with  . Elevated PSA    HPI: 67 year old male with a history of BPH status post TURP in 2017, elevated PSA returns the office today complaining of episodic painless gross hematuria.  He reports that about a month ago, his dog jumped on his bladder.  He subsequently experienced gross hematuria for several days, primarily at the initiation of voiding.  He had no other associated urinary symptoms.  The subsided and then several additional days.  He also saw blood after voiding following intercourse but no hematospermia.  He has no personal history of gross hematuria.  He had a cystoscopy just prior to TURP in 2017 for anatomic evaluation.  Cross-sectional imaging.  Never smoker.  He has been exposed to secondhand smoke.  He did take Flomax in the past but has not been taking this medication recently.  Overall, his urinary symptoms are excellent.  He reports no urgency, frequency, dysuria or any other symptoms.  He occasionally gets up at night to void but is not bothered by this.  Personal history of chronically elevated PSAs, PSA 5-7 range and is fact down to 6.1 from value check by PCP on 05/2018 fount to by 8.81.  Number showed prostamegaly 01/2018 but no nodules.  Remote history of negative prostate biopsy greater than 15 years ago.  PMH: Past Medical History:  Diagnosis Date  . Enlarged prostate with lower urinary tract symptoms (LUTS) 10/27/2015  . 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 SBO (small bowel obstruction) (HCC) 04/21/2017  . Urinary retention 11/17/2015  . Ventral hernia without obstruction or gangrene 05/11/2017   Added automatically from request for surgery [id number]    Surgical History: Past Surgical History:  Procedure Laterality Date  . HERNIA REPAIR  2018  .  TRANSURETHRAL RESECTION OF PROSTATE  2017    Home Medications:  Allergies as of 08/28/2018      Reactions   Prednisone Other (See Comments)   Very bad mood swings      Medication List        Accurate as of 08/28/18 10:33 AM. Always use your most recent med list.          aspirin 325 MG buffered tablet Take 325 mg by mouth daily.   fish oil-omega-3 fatty acids 1000 MG capsule Take 2 g by mouth daily.   lisinopril-hydrochlorothiazide 10-12.5 MG tablet Commonly known as:  PRINZIDE,ZESTORETIC TK 1 T PO D   multivitamin capsule Take 1 capsule by mouth daily.   pravastatin 40 MG tablet Commonly known as:  PRAVACHOL Take 40 mg by mouth daily.   zolpidem 10 MG tablet Commonly known as:  AMBIEN Take 10 mg by mouth as needed for sleep.       Allergies:  Allergies  Allergen Reactions  . Prednisone Other (See Comments)    Very bad mood swings    Family History: Family History  Problem Relation Age of Onset  . Prostate cancer Neg Hx   . Kidney cancer Neg Hx     Social History:  reports that he has never smoked. He has never used smokeless tobacco. He reports that he does not drink alcohol or use drugs.  ROS: UROLOGY Frequent Urination?: No Hard to postpone urination?: No Burning/pain with urination?: No Get up at night to urinate?: No Leakage of urine?: No Urine  stream starts and stops?: No Trouble starting stream?: No Do you have to strain to urinate?: No Blood in urine?: Yes Urinary tract infection?: No Sexually transmitted disease?: No Injury to kidneys or bladder?: No Painful intercourse?: No Weak stream?: No Erection problems?: No Penile pain?: No  Gastrointestinal Nausea?: No Vomiting?: No Indigestion/heartburn?: No Diarrhea?: No Constipation?: No  Constitutional Fever: No Night sweats?: No Weight loss?: No Fatigue?: No  Skin Skin rash/lesions?: No Itching?: No  Eyes Blurred vision?: No Double vision?:  No  Ears/Nose/Throat Sore throat?: No Sinus problems?: No  Hematologic/Lymphatic Swollen glands?: No Easy bruising?: No  Cardiovascular Leg swelling?: No Chest pain?: No  Respiratory Cough?: No Shortness of breath?: No  Endocrine Excessive thirst?: No  Musculoskeletal Back pain?: No Joint pain?: No  Neurological Headaches?: No Dizziness?: No  Psychologic Depression?: No Anxiety?: No  Physical Exam: BP (!) 151/91   Pulse 75   Wt 271 lb (122.9 kg)   BMI 36.75 kg/m   Constitutional:  Alert and oriented, No acute distress. HEENT: [place] AT, moist mucus membranes.  Trachea midline, no masses. Cardiovascular: No clubbing, cyanosis, or edema. Respiratory: Normal respiratory effort, no increased work of breathing. GI: Abdomen is soft, nontender, nondistended, no abdominal masses, obese GU: No CVA tenderness Skin: No rashes, bruises or suspicious lesions. Neurologic: Grossly intact, no focal deficits, moving all 4 extremities. Psychiatric: Normal mood and affect.  Laboratory Data: Lab Results  Component Value Date   WBC 7.7 11/20/2015   HGB 15.8 11/20/2015   HCT 44.3 11/20/2015   MCV 85 11/20/2015   PLT 125 Platelet count consistent in citrate (L) 11/20/2015    Lab Results  Component Value Date   CREATININE 0.93 07/13/2009   Component     Latest Ref Rng & Units 02/20/2018 08/21/2018  Prostate Specific Ag, Serum     0.0 - 4.0 ng/mL 5.8 (H) 6.1 (H)    Urinalysis Component     Latest Ref Rng & Units 08/21/2018  Specific Gravity, UA     1.005 - 1.030 1.020  pH, UA     5.0 - 7.5 5.5  Color, UA     Yellow Yellow  Appearance Ur     Clear Clear  Leukocytes, UA     Negative Negative  Protein, UA     Negative/Trace Negative  Glucose, UA     Negative Negative  Ketones, UA     Negative Negative  RBC, UA     Negative 3+ (A)  Bilirubin, UA     Negative Negative  Urobilinogen, Ur     0.2 - 1.0 mg/dL 0.2  Nitrite, UA     Negative Negative  Microscopic  Examination      See below:    Pertinent Imaging: Results for orders placed or performed in visit on 08/28/18  BLADDER SCAN AMB NON-IMAGING  Result Value Ref Range   Scan Result 70ml     Assessment & Plan:    1. Gross/ microscopic hematuria We discussed the differential diagnosis for microscopic/ gross hematuria including nephrolithiasis, renal or upper tract tumors, bladder stones, UTIs, or bladder tumors as well as undetermined etiologies. Per AUA guidelines, I did recommend complete microscopic hematuria evaluation including CTU, possible urine cytology, and office cystoscopy.  2. BPH without obstruction/lower urinary tract symptoms Symptoms minimal Adequate bladder emptying today - BLADDER SCAN AMB NON-IMAGING - CT HEMATURIA WORKUP; Future  3. Elevated PSA Chronically elevated PSAs in the 5-7 range Is likely appropriate for PSA density has been stable We  will continue to follow him closely on a every 6 month basis, will be due for rectal exam next visit in 6 months   Return in about 4 weeks (around 09/25/2018) for cysto.  Vanna ScotlandAshley Tiffnay Bossi, MD  [facility] [address], [address] [place], [place] [address] [phone]

## 2018-09-04 ENCOUNTER — Ambulatory Visit
Admission: RE | Admit: 2018-09-04 | Discharge: 2018-09-04 | Disposition: A | Discharge status: Home / Self Care | Payer: No Typology Code available for payment source | Source: Ambulatory Visit | Attending: Urology | Admitting: Urology

## 2018-09-04 DIAGNOSIS — N400 Enlarged prostate without lower urinary tract symptoms: Secondary | ICD-10-CM | POA: Diagnosis not present

## 2018-09-04 HISTORY — DX: Essential (primary) hypertension: I10

## 2018-09-04 LAB — POCT I-STAT CREATININE: Creatinine, Ser: 1 mg/dL (ref 0.61–1.24)

## 2018-09-04 MED ORDER — IOHEXOL 300 MG/ML  SOLN
125.0000 mL | Freq: Once | INTRAMUSCULAR | Status: AC | PRN
  Administered 2018-09-04: 125 mL via INTRAVENOUS

## 2018-10-09 ENCOUNTER — Ambulatory Visit: Payer: No Typology Code available for payment source | Admitting: Urology

## 2018-10-09 ENCOUNTER — Encounter: Payer: Self-pay | Admitting: Urology

## 2018-10-09 VITALS — BP 158/98 | HR 78 | Ht 72.0 in | Wt 270.0 lb

## 2018-10-09 DIAGNOSIS — N400 Enlarged prostate without lower urinary tract symptoms: Secondary | ICD-10-CM

## 2018-10-09 DIAGNOSIS — R310 Gross hematuria: Secondary | ICD-10-CM

## 2018-10-09 LAB — URINALYSIS, COMPLETE
Bilirubin, UA: NEGATIVE
GLUCOSE, UA: NEGATIVE
Ketones, UA: NEGATIVE
Leukocytes, UA: NEGATIVE
NITRITE UA: NEGATIVE
PH UA: 6 (ref 5.0–7.5)
Protein, UA: NEGATIVE
RBC, UA: NEGATIVE
Specific Gravity, UA: 1.025 (ref 1.005–1.030)
UUROB: 0.2 mg/dL (ref 0.2–1.0)

## 2018-10-09 NOTE — Progress Notes (Signed)
   10/09/2018   CC:  Chief Complaint  Patient presents with  . Cysto    gross hematuria    HPI: Eric Mckenzie is a 68 y.o. male with a history of BPH s/p TURP (2017) and elevated PSA presents today for a cystoscopy following episodic painless gross hematuria  CT urogram 12/3 small subcentimeter renal lesions on left and prostemegaly.  CT imaging was personally reviewed today with the patient.  Patient denies any urinary symptoms today and reports good stream with urination.  Blood pressure (!) 158/98, pulse 78, height 6' (1.829 m), weight 270 lb (122.5 kg). NED. A&Ox3.   No respiratory distress   Abd soft, NT, ND Normal phallus with bilateral descended testicles  Cystoscopy Procedure Note  Patient identification was confirmed, informed consent was obtained, and patient was prepped using Betadine solution.  Lidocaine jelly was administered per urethral meatus.     Pre-Procedure: - Inspection reveals a normal caliber ureteral meatus.  Procedure: The flexible cystoscope was introduced without difficulty - No urethral strictures/lesions are present. - Enlarged prostate with irregular fossa and kissing lateral lobes. .  - Bladder is mildly trabeculated - Normal bladder neck - Bilateral ureteral orifices identified - Bladder mucosa  reveals no ulcers, tumors, or lesions - No bladder stones - No trabeculation  Retroflexion shows regrowth of lateral lobe component with TURP defect. Irregular hypervascularity on surface of prostate regrowth.  Post-Procedure: - Patient tolerated the procedure well  Assessment/ Plan:  1. Left Sided Renal Lesions Discussed CT scan findings with patients, will defer further imaging at this time, statistically benign.  2. BPH without obstruction/lower urinary tract symptoms Enlarged prostate notable on CT and cystoscopy Asymptommatic Significant prostatic regrowth appreciated during cysto but will defer any pharmaco- or surgical therapy  until he becomes symptomatic Suspect at some point down the road, will likely need another outlet procedure based on his degree of visual obstruction today  3. Hematuria  Probably secondary to median lobe regrowth with TURP defect and associated irregular hypevascularity  Return in about 6 months (around 04/09/2019) for PSA prior.  Vanna Scotland, MD  I, Robbi Garter , am acting as a scribe for Vanna Scotland, MD  I have reviewed the above documentation for accuracy and completeness, and I agree with the above.   Vanna Scotland, MD

## 2018-11-27 ENCOUNTER — Other Ambulatory Visit: Payer: Self-pay

## 2018-11-29 ENCOUNTER — Other Ambulatory Visit: Payer: Self-pay | Admitting: Family Medicine

## 2018-11-29 ENCOUNTER — Ambulatory Visit
Admission: RE | Admit: 2018-11-29 | Discharge: 2018-11-29 | Disposition: A | Discharge status: Home / Self Care | Payer: No Typology Code available for payment source | Source: Ambulatory Visit | Attending: Family Medicine | Admitting: Family Medicine

## 2018-11-29 DIAGNOSIS — R109 Unspecified abdominal pain: Secondary | ICD-10-CM

## 2019-02-19 ENCOUNTER — Other Ambulatory Visit: Payer: No Typology Code available for payment source

## 2019-02-26 ENCOUNTER — Ambulatory Visit: Payer: No Typology Code available for payment source | Admitting: Urology

## 2019-04-04 ENCOUNTER — Other Ambulatory Visit: Payer: Self-pay | Admitting: Family Medicine

## 2019-04-04 DIAGNOSIS — R972 Elevated prostate specific antigen [PSA]: Secondary | ICD-10-CM

## 2019-04-08 ENCOUNTER — Other Ambulatory Visit: Payer: Self-pay

## 2019-04-08 ENCOUNTER — Other Ambulatory Visit: Payer: Medicare PPO

## 2019-04-08 DIAGNOSIS — R972 Elevated prostate specific antigen [PSA]: Secondary | ICD-10-CM

## 2019-04-09 ENCOUNTER — Ambulatory Visit (INDEPENDENT_AMBULATORY_CARE_PROVIDER_SITE_OTHER): Payer: Medicare PPO | Admitting: Urology

## 2019-04-09 ENCOUNTER — Encounter: Payer: Self-pay | Admitting: Urology

## 2019-04-09 VITALS — BP 134/83 | HR 87 | Ht 72.0 in | Wt 267.0 lb

## 2019-04-09 DIAGNOSIS — R972 Elevated prostate specific antigen [PSA]: Secondary | ICD-10-CM | POA: Diagnosis not present

## 2019-04-09 DIAGNOSIS — Z87448 Personal history of other diseases of urinary system: Secondary | ICD-10-CM | POA: Diagnosis not present

## 2019-04-09 DIAGNOSIS — N400 Enlarged prostate without lower urinary tract symptoms: Secondary | ICD-10-CM | POA: Diagnosis not present

## 2019-04-09 DIAGNOSIS — Z87898 Personal history of other specified conditions: Secondary | ICD-10-CM

## 2019-04-09 LAB — PSA: Prostate Specific Ag, Serum: 6.1 ng/mL — ABNORMAL HIGH (ref 0.0–4.0)

## 2019-04-09 NOTE — Progress Notes (Signed)
04/09/2019 10:50 AM   Eric Mckenzie 07-Aug-1951 [id number]  Referring provider: Harlan Stains, MD [name],  [place] [address]  Chief Complaint  Patient presents with  . Benign Prostatic Hypertrophy    HPI: 68 year old male with a history of BPH, gross hematuria who returns today for routine 35-month follow-up.  He underwent a painless gross hematuria work-up and 10/2018 including CT urogram and cystoscopy.  This revealed significant regrowth with friable prostatic mucosa following TURP in 2017.  A median lobe is also present.  Notably, he also has a personal history of ITP.  Since the last visit visit, he is had no further episodes of gross hematuria.  Overall, his urinary symptoms are excellent.  IPSS as below.  Note history of Flomax, not currently taking.  Personal history of chronically elevated PSAs, PSA 5-7 range.  PSA today is 6.1 (stable from 6 months ago).   Remote history of negative prostate biopsy greater than 15+ years ago.  IPSS    Row Name 04/09/19 1000         International Prostate Symptom Score   How often have you had the sensation of not emptying your bladder?  Less than 1 in 5     How often have you had to urinate less than every two hours?  Less than 1 in 5 times     How often have you found you stopped and started again several times when you urinated?  Not at All     How often have you found it difficult to postpone urination?  Not at All     How often have you had a weak urinary stream?  Not at All     How often have you had to strain to start urination?  Not at All     How many times did you typically get up at night to urinate?  1 Time     Total IPSS Score  3       Quality of Life due to urinary symptoms   If you were to spend the rest of your life with your urinary condition just the way it is now how would you feel about that?  Mostly Disatisfied        Score:  1-7 Mild 8-19 Moderate 20-35 Severe   PMH:  Past Medical History:  Diagnosis Date  . Enlarged prostate with lower urinary tract symptoms (LUTS) 10/27/2015  . Hypertension   . 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 SBO (small bowel obstruction) ([facility]) 04/21/2017  . Urinary retention 11/17/2015  . Ventral hernia without obstruction or gangrene 05/11/2017   Added automatically from request for surgery [id number]    Surgical History: Past Surgical History:  Procedure Laterality Date  . HERNIA REPAIR  2018  . TRANSURETHRAL RESECTION OF PROSTATE  2017    Home Medications:  Allergies as of 04/09/2019      Reactions   Prednisone Other (See Comments)   Very bad mood swings      Medication List       Accurate as of April 09, 2019 10:50 AM. If you have any questions, ask your nurse or doctor.        aspirin 325 MG buffered tablet Take 325 mg by mouth daily.   fish oil-omega-3 fatty acids 1000 MG capsule Take 2 g by mouth daily.   lisinopril-hydrochlorothiazide 10-12.5 MG tablet Commonly known as: ZESTORETIC TK 1 T PO D   multivitamin capsule Take 1  capsule by mouth daily.   pravastatin 40 MG tablet Commonly known as: PRAVACHOL Take 40 mg by mouth daily.   zolpidem 10 MG tablet Commonly known as: AMBIEN Take 10 mg by mouth as needed for sleep.       Allergies:  Allergies  Allergen Reactions  . Prednisone Other (See Comments)    Very bad mood swings    Family History: Family History  Problem Relation Age of Onset  . Prostate cancer Neg Hx   . Kidney cancer Neg Hx     Social History:  reports that he has never smoked. He has never used smokeless tobacco. He reports that he does not drink alcohol or use drugs.  ROS: UROLOGY Frequent Urination?: No Hard to postpone urination?: No Burning/pain with urination?: No Get up at night to urinate?: Yes Leakage of urine?: No Urine stream starts and stops?: No Trouble starting stream?: No Do you have to strain to urinate?: No Blood in urine?: No  Urinary tract infection?: No Sexually transmitted disease?: No Injury to kidneys or bladder?: No Painful intercourse?: No Weak stream?: No Erection problems?: Yes Penile pain?: No  Gastrointestinal Nausea?: No Vomiting?: No Indigestion/heartburn?: No Diarrhea?: No Constipation?: No  Constitutional Fever: No Night sweats?: No Weight loss?: No Fatigue?: No  Skin Skin rash/lesions?: No Itching?: No  Eyes Blurred vision?: No Double vision?: No  Ears/Nose/Throat Sore throat?: No Sinus problems?: No  Hematologic/Lymphatic Swollen glands?: No Easy bruising?: No  Cardiovascular Leg swelling?: No Chest pain?: No  Respiratory Cough?: No Shortness of breath?: No  Endocrine Excessive thirst?: No  Musculoskeletal Back pain?: No Joint pain?: No  Neurological Headaches?: No Dizziness?: No  Psychologic Depression?: No Anxiety?: No  Physical Exam: BP 134/83   Pulse 87   Ht 6' (1.829 m)   Wt 267 lb (121.1 kg)   BMI 36.21 kg/m   Constitutional:  Alert and oriented, No acute distress. HEENT: [place] AT, moist mucus membranes.  Trachea midline, no masses. Cardiovascular: No clubbing, cyanosis, or edema. Respiratory: Normal respiratory effort, no increased work of breathing. GI: Abdomen is soft, nontender, nondistended, no abdominal masses GU: No CVA tenderness Rectal: Normal sphincter tone.  Mildly enlarged prostate, nontender, no nodules. Skin: No rashes, bruises or suspicious lesions. Neurologic: Grossly intact, no focal deficits, moving all 4 extremities. Psychiatric: Normal mood and affect.  Laboratory Data: Lab Results  Component Value Date   WBC 7.7 11/20/2015   HGB 15.8 11/20/2015   HCT 44.3 11/20/2015   MCV 85 11/20/2015   PLT 125 Platelet count consistent in citrate (L) 11/20/2015    Lab Results  Component Value Date   CREATININE 1.00 09/04/2018    Assessment & Plan:    1. BPH without obstruction/lower urinary tract symptoms Relatively  asymptomatic despite cystoscopic regrowth We will continue to follow conservatively Excellent IPSS today  2. Elevated PSA Personal history chronically elevated PSA in the 5-7 range PSA today stable, 6.1, unchanged from 6 months ago which is reassuring Rectal exam up-to-date We will continue to follow him annually He is agreeable this plan - PSA; Future  3. History of gross hematuria No further episodes of gross hematuria We will check UA next year   Return in about 1 year (around 04/08/2020) for PSA/ DRE/ IPSS/ UA.  Vanna ScotlandAshley Tierra Divelbiss, MD  [facility] [address], [address] [place], [place] [address] [phone]

## 2019-06-24 ENCOUNTER — Encounter: Payer: Self-pay | Admitting: Podiatry

## 2019-06-24 ENCOUNTER — Ambulatory Visit: Payer: Medicare PPO | Admitting: Podiatry

## 2019-06-24 ENCOUNTER — Other Ambulatory Visit: Payer: Self-pay

## 2019-06-24 ENCOUNTER — Other Ambulatory Visit: Payer: Self-pay | Admitting: Podiatry

## 2019-06-24 ENCOUNTER — Ambulatory Visit (INDEPENDENT_AMBULATORY_CARE_PROVIDER_SITE_OTHER): Payer: Medicare PPO

## 2019-06-24 DIAGNOSIS — M722 Plantar fascial fibromatosis: Secondary | ICD-10-CM | POA: Diagnosis not present

## 2019-06-24 DIAGNOSIS — M79671 Pain in right foot: Secondary | ICD-10-CM

## 2019-06-24 MED ORDER — DICLOFENAC SODIUM 75 MG PO TBEC: 75.0000 mg | DELAYED_RELEASE_TABLET | Freq: Two times a day (BID) | ORAL | 1 refills | Status: DC

## 2019-06-29 NOTE — Progress Notes (Signed)
   Subjective: 68 y.o. male presenting today as a new patient with a chief complaint of right plantar heel pain that began about three weeks ago. He reports associated pain that radiates up into the posterior leg. He states he had similar issues about twenty years ago. He has not had any treatment and denies any modifying factors. He denies any known trauma or injury. Patient is here for further evaluation and treatment.   Past Medical History:  Diagnosis Date  . Enlarged prostate with lower urinary tract symptoms (LUTS) 10/27/2015  . Hypertension   . 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 SBO (small bowel obstruction) ([place]) 04/21/2017  . Urinary retention 11/17/2015  . Ventral hernia without obstruction or gangrene 05/11/2017   Added automatically from request for surgery [id number]     Objective: Physical Exam General: The patient is alert and oriented x3 in no acute distress.  Dermatology: Skin is warm, dry and supple bilateral lower extremities. Negative for open lesions or macerations bilateral.   Vascular: Dorsalis Pedis and Posterior Tibial pulses palpable bilateral.  Capillary fill time is immediate to all digits.  Neurological: Epicritic and protective threshold intact bilateral.   Musculoskeletal: Tenderness to palpation to the plantar aspect of the right heel along the plantar fascia. All other joints range of motion within normal limits bilateral. Strength 5/5 in all groups bilateral.   Radiographic exam: Normal osseous mineralization. Joint spaces preserved. No fracture/dislocation/boney destruction. No other soft tissue abnormalities or radiopaque foreign bodies.   Assessment: 1. Plantar fasciitis right  Plan of Care:  1. Patient evaluated. Xrays reviewed.   2. Injection of 0.5cc Celestone soluspan injected into the right plantar fascia  3. CAM boot dispensed. Weightbearing for one week.  4. Rx for Diclofenac ordered for patient. 5. Note for work provided  to be out for one week.  6. Instructed patient regarding therapies and modalities at home to alleviate symptoms.  7. Return to clinic in 4 weeks.    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    Edrick Kins, DPM [facility]  Dr. Edrick Kins, DPM    [address], [place] [address]                Office [phone]  Fax [phone]

## 2019-07-22 ENCOUNTER — Ambulatory Visit: Payer: Medicare PPO | Admitting: Podiatry

## 2019-10-08 ENCOUNTER — Other Ambulatory Visit: Payer: Self-pay

## 2019-10-14 ENCOUNTER — Ambulatory Visit
Admission: RE | Admit: 2019-10-14 | Discharge: 2019-10-14 | Disposition: A | Discharge status: Home / Self Care | Payer: 59 | Source: Ambulatory Visit | Attending: Chiropractor | Admitting: Chiropractor

## 2019-10-14 ENCOUNTER — Other Ambulatory Visit: Payer: Self-pay

## 2019-10-14 ENCOUNTER — Other Ambulatory Visit: Payer: Self-pay | Admitting: Chiropractor

## 2019-10-14 DIAGNOSIS — M545 Low back pain, unspecified: Secondary | ICD-10-CM

## 2020-03-31 ENCOUNTER — Other Ambulatory Visit: Payer: Self-pay

## 2020-03-31 ENCOUNTER — Other Ambulatory Visit: Payer: Self-pay | Admitting: *Deleted

## 2020-03-31 ENCOUNTER — Other Ambulatory Visit: Payer: 59

## 2020-03-31 DIAGNOSIS — R972 Elevated prostate specific antigen [PSA]: Secondary | ICD-10-CM

## 2020-03-31 NOTE — Progress Notes (Signed)
Left patient a VM to call the office to schedule a lab visit for PSA before appointment.

## 2020-04-01 LAB — PSA: Prostate Specific Ag, Serum: 5.8 ng/mL — ABNORMAL HIGH (ref 0.0–4.0)

## 2020-04-04 NOTE — Progress Notes (Signed)
03/03/20 10:56 AM   Eric Mckenzie Aug 30, 1951 [id number]  Referring provider: Laurann Montana, MD [phone] [address] [place],  [place] [address] Chief Complaint  Patient presents with   Elevated PSA    HPI: Eric Mckenzie is a 69 y.o. M with a history of BPH, gross hematuria who returns today for routine 1 year  follow-up.  He underwent a painless gross hematuria work-up and 10/2018 including CT urogram and cystoscopy.  This revealed significant regrowth with friable prostatic mucosa following TURP in 2017.  A median lobe is also present.  Notably, he also has a personal history of ITP.  PSA 5.8 as of 03/31/20.   Personal history of chronically elevated PSAs,PSA 5-7 range.  PSA today is 6.1 (stable from 6 months ago). Remote history of negative prostate biopsy greater than 15+ years ago.  The patient was doing well today.  IPSS as below.   He has a trip planned for April to [place]. Plans to retire in 2022.   He had some hematuria following intercourse 8 months ago which resolved immediately and has not recurred.   He notes some erectile dysfunction.  Patient does not like Viagra because they give him headaches.   He is not able to maintain his erection for more than a few minutes which is not satisfactory.  He also reports today that he has had decreased desire.  He had his testosterone checked for low libido and told that this is normal.  He does report that he is had a tough year in general.   PSA trend:   Component     Latest Ref Rng & Units 02/20/2018 08/21/2018 04/08/2019 03/31/2020  Prostate Specific Ag, Serum     0.0 - 4.0 ng/mL 5.8 (H) 6.1 (H) 6.1 (H) 5.8 (H)    IPSS    Row Name 04/07/20 0800         International Prostate Symptom Score   How often have you had the sensation of not emptying your bladder? Less than half the time     How often have you had to urinate less than every two hours? Less than 1 in 5 times     How often have you found you  stopped and started again several times when you urinated? Less than 1 in 5 times     How often have you found it difficult to postpone urination? Not at All     How often have you had a weak urinary stream? Not at All     How often have you had to strain to start urination? Not at All     How many times did you typically get up at night to urinate? 1 Time     Total IPSS Score 5       Quality of Life due to urinary symptoms   If you were to spend the rest of your life with your urinary condition just the way it is now how would you feel about that? 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 If you have any questions, ask your nurse or doctor.        aspirin 325 MG buffered tablet Take 325 mg by mouth daily.   diclofenac 75 MG EC tablet Commonly known as: VOLTAREN Take 1 tablet (75 mg total) by mouth 2 (two) times daily.   fish oil-omega-3 fatty acids 1000 MG capsule Take 2 g by mouth daily.   lisinopril-hydrochlorothiazide 20-12.5 MG tablet Commonly known as: ZESTORETIC What changed: Another medication with the same name was removed. Continue taking this medication, and follow the directions you see here. Changed by: Vanna Scotland, MD   loratadine 10 MG tablet Commonly known  as: CLARITIN   multivitamin capsule Take 1 capsule by mouth daily.   pravastatin 40 MG tablet Commonly known as: PRAVACHOL Take 40 mg by mouth daily.   rOPINIRole 0.5 MG tablet Commonly known as: REQUIP Take 0.5 mg by mouth 3 (three) times daily.   sildenafil 20 MG tablet Commonly known as: Revatio Take 1 tablet (20 mg total) by mouth as needed. Take 1-5 tabs as needed prior to intercourse Started by: Vanna Scotland, MD   zolpidem 10 MG tablet Commonly known as: AMBIEN Take 10 mg by mouth as needed for sleep.       Allergies:  Allergies  Allergen Reactions   Prednisone Other (See Comments)    Very bad mood swings    Family History: Family History  Problem Relation Age of Onset   Prostate cancer Neg Hx    Kidney cancer Neg Hx     Social History:  reports that he has never smoked. He has never used smokeless tobacco. He reports that he does not drink alcohol and does not use drugs. Patient will retired in 2022.    Physical Exam: BP (!) 147/77    Pulse 80    Ht 6' (1.829 m)    Wt 266 lb (120.7 kg)    BMI 36.08 kg/m   Constitutional:  Alert and oriented, No acute distress. HEENT:  AT, moist mucus membranes.  Trachea midline, no masses. Cardiovascular: No clubbing, cyanosis, or edema. Respiratory: Normal respiratory effort, no increased work of breathing. Rectal exam: Normal Sphincter tone, normal prostate, no nodules/tenderness, 50 + grams Skin: No rashes, bruises or suspicious lesions. Neurologic: Grossly intact, no focal deficits, moving all 4 extremities. Psychiatric: Normal mood and affect.  Laboratory Data:  Urinalysis Negative  Assessment & Plan:    1. BPH w/o obstruction  We will continue to follow conservatively Excellent IPSS today  2. Elevated PSA  Personal history chronically elevated PSA in the 5-7 range PSA trended down 5.8 as of  Rectal exam up-to-date We will continue to follow him annually  3. Hx of gross hematuria  No further  episodes of gross hematuria Isolated episode following intercourse 8 months ago, will continue to follow and if he has recurrent episodes next year, consider repeat work-up  4. Erectile Dysfunction/ low libido Failed PDE 5 inhibitors  We discussed the pathophysiology of erectile dysfunction today along with possible congestive any factors. Failed PDE 5 inhibitors, vacuum erectile device, intracavernosal injection, MUSE, and placement of  the inflatable or malleable penal prosthesis for refractory cases.  In terms of PDE 5 inhibitors, we discussed contraindications for this medication as well as common side effects. Patient was counseled on optimal use. All of his questions were answered in detail.  Patient is not interested in intracavernosal injection.  He does mention low libido today, offered repeat testosterone testing and is not interested in this.  We did discuss overall lifestyle choices such as weight loss, encouraging physical activity, diet and mental wellness as these may be contributing factors.  Return for 1 year for PSA/ DRE/ IPSS/ UA.   [facility] [address], [address] [place], [place] [address] [phone]  I, Theador Hawthorne, am acting as a scribe for Dr. Vanna Scotland,  I have reviewed the above documentation for accuracy and completeness, and I agree with the above.   Vanna Scotland, MD   I spent 40 total minutes on the day of the encounter including pre-visit review of the medical record, face-to-face time with the patient, and post visit ordering of labs/imaging/tests.

## 2020-04-07 ENCOUNTER — Ambulatory Visit: Payer: 59 | Admitting: Urology

## 2020-04-07 ENCOUNTER — Telehealth: Payer: Self-pay | Admitting: *Deleted

## 2020-04-07 ENCOUNTER — Other Ambulatory Visit: Payer: Self-pay

## 2020-04-07 VITALS — BP 147/77 | HR 80 | Ht 72.0 in | Wt 266.0 lb

## 2020-04-07 DIAGNOSIS — N400 Enlarged prostate without lower urinary tract symptoms: Secondary | ICD-10-CM

## 2020-04-07 MED ORDER — SILDENAFIL CITRATE 20 MG PO TABS: 20.0000 mg | ORAL_TABLET | ORAL | 11 refills | Status: DC | PRN

## 2020-04-07 NOTE — Telephone Encounter (Signed)
Left a VM to return my call regarding [facility] for RX sildenafil.

## 2020-04-08 LAB — URINALYSIS, COMPLETE
Bilirubin, UA: NEGATIVE
Glucose, UA: NEGATIVE
Ketones, UA: NEGATIVE
Leukocytes,UA: NEGATIVE
Nitrite, UA: NEGATIVE
Protein,UA: NEGATIVE
RBC, UA: NEGATIVE
Specific Gravity, UA: 1.025 (ref 1.005–1.030)
Urobilinogen, Ur: 0.2 mg/dL (ref 0.2–1.0)
pH, UA: 5 (ref 5.0–7.5)

## 2020-04-08 LAB — MICROSCOPIC EXAMINATION
Bacteria, UA: NONE SEEN
Epithelial Cells (non renal): NONE SEEN /hpf (ref 0–10)

## 2020-04-09 MED ORDER — SILDENAFIL CITRATE 20 MG PO TABS: 20.0000 mg | ORAL_TABLET | ORAL | 11 refills | Status: DC | PRN

## 2020-04-09 NOTE — Addendum Note (Signed)
Addended by: Milas Kocher A on: 04/09/2020 11:19 AM   Modules accepted: Orders

## 2020-04-09 NOTE — Telephone Encounter (Signed)
Spoke with patient, send in RX to [employer] coupon to patient. Verbalized understanding.

## 2021-02-01 DIAGNOSIS — M7989 Other specified soft tissue disorders: Secondary | ICD-10-CM | POA: Diagnosis not present

## 2021-02-15 DIAGNOSIS — G2581 Restless legs syndrome: Secondary | ICD-10-CM | POA: Diagnosis not present

## 2021-02-15 DIAGNOSIS — E785 Hyperlipidemia, unspecified: Secondary | ICD-10-CM | POA: Diagnosis not present

## 2021-02-15 DIAGNOSIS — I10 Essential (primary) hypertension: Secondary | ICD-10-CM | POA: Diagnosis not present

## 2021-02-15 DIAGNOSIS — G4700 Insomnia, unspecified: Secondary | ICD-10-CM | POA: Diagnosis not present

## 2021-04-08 ENCOUNTER — Other Ambulatory Visit: Payer: Self-pay

## 2021-04-13 ENCOUNTER — Ambulatory Visit: Payer: Self-pay | Admitting: Urology

## 2021-04-28 DIAGNOSIS — L820 Inflamed seborrheic keratosis: Secondary | ICD-10-CM | POA: Diagnosis not present

## 2021-04-28 DIAGNOSIS — L821 Other seborrheic keratosis: Secondary | ICD-10-CM | POA: Diagnosis not present

## 2021-04-28 DIAGNOSIS — L814 Other melanin hyperpigmentation: Secondary | ICD-10-CM | POA: Diagnosis not present

## 2021-05-03 ENCOUNTER — Other Ambulatory Visit: Payer: Self-pay

## 2021-05-03 DIAGNOSIS — N400 Enlarged prostate without lower urinary tract symptoms: Secondary | ICD-10-CM

## 2021-05-03 NOTE — Addendum Note (Signed)
Addended by: Lizbeth Bark on: 05/03/2021 01:10 PM   Modules accepted: Orders

## 2021-05-03 NOTE — Progress Notes (Signed)
05/06/21 9:50 AM   Eric Mckenzie 25-Mar-1951 [id number]  Referring provider:  Laurann Montana, MD [phone] [address] [place],  [place] [address] Chief Complaint  Patient presents with   Benign Prostatic Hypertrophy     HPI: Eric Mckenzie is a 70 y.o.male with a history of BPH, gross hematuria who presents for routine annual follow up.    He underwent a painless gross hematuria work-up and 10/2018 including CT urogram and cystoscopy.  This revealed significant regrowth with friable prostatic mucosa following TURP in 2017.    During last visit 03/03/2020 he noted erectile dysfunction.  He has been using small amounts of sildenafil but has not been up to 100 mg due to concern for priapism.  She also has a personal history of elevated PSA.  His PSA trend is upwards, most recently as high as 8.6.  Patient states he injured his back recently and questioned if this could cause the elevation of PSA.   Today, he denies any significant voiding symptoms.  He is overall pleased with this urinary issues.  Is on no BPH medications.    PSA 8.6 PVR 30 mL IPSS: 4  PSA Trend:  Component     Latest Ref Rng & Units 04/08/2019 03/31/2020 05/04/2021  Prostate Specific Ag, Serum     0.0 - 4.0 ng/mL 6.1 (H) 5.8 (H) 8.6 (H)     IPSS     Row Name 05/05/21 0900         International Prostate Symptom Score   How often have you had the sensation of not emptying your bladder? Less than 1 in 5     How often have you had to urinate less than every two hours? Less than 1 in 5 times     How often have you found you stopped and started again several times when you urinated? Not at All     How often have you found it difficult to postpone urination? Not at All     How often have you had a weak urinary stream? Less than 1 in 5 times     How often have you had to strain to start urination? Not at All     How many times did you typically get up at night to urinate? 1 Time     Total IPSS Score 4            Quality of Life due to urinary symptoms     If you were to spend the rest of your life with your urinary condition just the way it is now how would you feel about that? 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 If you have any questions, ask your nurse or doctor.          aspirin 325 MG buffered tablet Take 325 mg by mouth daily.   diclofenac 75 MG EC tablet Commonly known as: VOLTAREN Take 1 tablet (75 mg total) by mouth 2 (two) times daily.   fish oil-omega-3 fatty acids 1000 MG capsule Take 2 g by mouth daily.   lisinopril-hydrochlorothiazide 20-12.5 MG tablet Commonly known as: ZESTORETIC   loratadine 10 MG tablet Commonly known as: CLARITIN   multivitamin capsule Take 1 capsule by mouth daily.   pravastatin 40 MG tablet Commonly known as: PRAVACHOL Take 40 mg by mouth daily.   rOPINIRole 1 MG tablet Commonly known as: REQUIP Take 1 mg by mouth 2 (two) times daily. What changed: Another medication with the same name was removed. Continue taking this medication, and follow the directions you see here. Changed by: Vanna Scotland, MD   sildenafil 20 MG tablet Commonly known as: Revatio Take 1 tablet (20 mg  total) by mouth as needed. Take 1-5 tabs as needed prior to intercourse   zolpidem 10 MG tablet Commonly known as: AMBIEN Take 10 mg by mouth as needed for sleep.        Allergies:  Allergies  Allergen Reactions   Prednisone Other (See Comments)    Very bad mood swings    Family History: Family History  Problem Relation Age of Onset   Prostate cancer Neg Hx    Kidney cancer Neg Hx     Social History:  reports that he has never smoked. He has never used smokeless tobacco. He reports that he does not drink alcohol and does not use drugs.   Physical Exam: BP 126/72   Pulse 69   Ht 6' (1.829 m)   Wt 268 lb (121.6 kg)   BMI 36.35 kg/m   Constitutional:  Alert and oriented, No acute distress. HEENT: [place] AT, moist mucus membranes.  Trachea midline, no masses. Cardiovascular: No clubbing, cyanosis, or edema. Respiratory: Normal respiratory effort, no increased work of breathing. Rectal: Normal sphincter tone.  Enlarged, rubbery, nontender prostate.  No nodules. Skin: No rashes, bruises or suspicious lesions. Neurologic: Grossly intact, no focal deficits, moving all 4 extremities. Psychiatric: Normal mood and affect.   Urinalysis:  3-10 RBC, but otherwise unremarkable    Pertinent Imaging: Results for orders placed or performed in visit on 05/05/21  Microscopic Examination   Urine  Result Value Ref Range   WBC, UA 0-5 0 - 5 /hpf   RBC 3-10 (A) 0 - 2 /hpf   Epithelial Cells (non renal) 0-10 0 - 10 /hpf   Bacteria, UA None seen None seen/Few  Urinalysis, Complete  Result Value Ref Range   Specific Gravity, UA 1.025 1.005 - 1.030   pH, UA 5.0 5.0 - 7.5   Color, UA Yellow Yellow   Appearance Ur Clear Clear   Leukocytes,UA Negative Negative   Protein,UA Negative Negative/Trace   Glucose, UA Negative Negative   Ketones, UA Negative Negative   RBC, UA Trace (A) Negative   Bilirubin, UA Negative Negative   Urobilinogen, Ur 0.2 0.2 - 1.0 mg/dL   Nitrite, UA Negative  Negative   Microscopic Examination See below:   BLADDER SCAN AMB NON-IMAGING  Result Value Ref Range   Scan Result 30 ml       Assessment & Plan:    BPH w/o obstruction  - extremely well with  no medications   Rising PSA  - Will repeat PSA in a month if it does not lower we will recommend a prostate biopsy We discussed prostate biopsy in detail including the procedure itself, the risks of blood in the urine, stool, and ejaculate, serious infection, and discomfort. He is willing to proceed with this as discussed. - He will need to hold his aspirin   - He has history of ITP but currently no thrombocytopenia, most recent platelets normal  3. Microscopic hematuria  - Will schedule a cystoscopy and RUS to further investigate   4. Erectile dysfunction - Sildenafil has been ineffective, only taking 2 a day.  -advised to increase dose to 100 mg prn  Follow-up for PSA in 4 weeks and cysto in 6 weeks   Karsten Ro Shaliyah Taite,acting as a scribe for Vanna Scotland, MD.,have documented all relevant documentation on the behalf of Vanna Scotland, MD,as directed by  Vanna Scotland, MD while in the presence of Vanna Scotland, MD.  Vanna Scotland, MD   [facility] [address], [address] [place], [place] [address] [phone]

## 2021-05-04 ENCOUNTER — Other Ambulatory Visit: Payer: PPO

## 2021-05-04 ENCOUNTER — Other Ambulatory Visit: Payer: Self-pay

## 2021-05-04 DIAGNOSIS — N400 Enlarged prostate without lower urinary tract symptoms: Secondary | ICD-10-CM | POA: Diagnosis not present

## 2021-05-05 ENCOUNTER — Other Ambulatory Visit: Payer: Self-pay

## 2021-05-05 ENCOUNTER — Ambulatory Visit: Payer: PPO | Admitting: Urology

## 2021-05-05 VITALS — BP 126/72 | HR 69 | Ht 72.0 in | Wt 268.0 lb

## 2021-05-05 DIAGNOSIS — R3129 Other microscopic hematuria: Secondary | ICD-10-CM | POA: Diagnosis not present

## 2021-05-05 DIAGNOSIS — N400 Enlarged prostate without lower urinary tract symptoms: Secondary | ICD-10-CM | POA: Diagnosis not present

## 2021-05-05 LAB — URINALYSIS, COMPLETE
Bilirubin, UA: NEGATIVE
Glucose, UA: NEGATIVE
Ketones, UA: NEGATIVE
Leukocytes,UA: NEGATIVE
Nitrite, UA: NEGATIVE
Protein,UA: NEGATIVE
Specific Gravity, UA: 1.025 (ref 1.005–1.030)
Urobilinogen, Ur: 0.2 mg/dL (ref 0.2–1.0)
pH, UA: 5 (ref 5.0–7.5)

## 2021-05-05 LAB — MICROSCOPIC EXAMINATION: Bacteria, UA: NONE SEEN

## 2021-05-05 LAB — BLADDER SCAN AMB NON-IMAGING: Scan Result: 30

## 2021-05-05 LAB — PSA: Prostate Specific Ag, Serum: 8.6 ng/mL — ABNORMAL HIGH (ref 0.0–4.0)

## 2021-05-06 ENCOUNTER — Other Ambulatory Visit: Payer: 59

## 2021-05-11 ENCOUNTER — Ambulatory Visit: Payer: 59 | Admitting: Urology

## 2021-05-31 ENCOUNTER — Ambulatory Visit
Admission: RE | Admit: 2021-05-31 | Discharge: 2021-05-31 | Disposition: A | Discharge status: Home / Self Care | Payer: PPO | Source: Ambulatory Visit | Attending: Urology | Admitting: Urology

## 2021-05-31 ENCOUNTER — Other Ambulatory Visit: Payer: Self-pay

## 2021-05-31 DIAGNOSIS — R3129 Other microscopic hematuria: Secondary | ICD-10-CM | POA: Insufficient documentation

## 2021-05-31 DIAGNOSIS — N400 Enlarged prostate without lower urinary tract symptoms: Secondary | ICD-10-CM | POA: Insufficient documentation

## 2021-06-03 ENCOUNTER — Other Ambulatory Visit: Payer: PPO

## 2021-06-03 ENCOUNTER — Other Ambulatory Visit: Payer: Self-pay

## 2021-06-03 DIAGNOSIS — N400 Enlarged prostate without lower urinary tract symptoms: Secondary | ICD-10-CM | POA: Diagnosis not present

## 2021-06-04 LAB — PSA: Prostate Specific Ag, Serum: 6.1 ng/mL — ABNORMAL HIGH (ref 0.0–4.0)

## 2021-06-09 ENCOUNTER — Telehealth: Payer: Self-pay | Admitting: Urology

## 2021-06-09 MED ORDER — DIAZEPAM 10 MG PO TABS: 10.0000 mg | ORAL_TABLET | Freq: Once | ORAL | 0 refills | Status: AC

## 2021-06-09 NOTE — Telephone Encounter (Signed)
Patient left a message wanting to speak with either Dr. Apolinar Junes or one of her MA's in regards to his upcoming appt. He said he has questions, but did not specify what questions he has.

## 2021-06-09 NOTE — Telephone Encounter (Signed)
Looks like the PSA has gone back down to baseline thus we can hold off on biopsy for now.  If he feels like he absolutely needs something, I will send a Valium to his pharmacy.  He must have a driver.  Vanna Scotland, MD

## 2021-06-09 NOTE — Addendum Note (Signed)
Addended by: Vanna Scotland on: 06/09/2021 02:53 PM   Modules accepted: Orders

## 2021-06-09 NOTE — Telephone Encounter (Signed)
Pt called asking for medication to help with anxiety for his upcomming cystoscopy. He is also wondering if he still needs a prostate biopsy. If so he does not want it the same day as his cysto

## 2021-06-10 NOTE — Telephone Encounter (Signed)
Patient advised, voiced understanding.

## 2021-06-15 NOTE — Progress Notes (Signed)
   06/16/2021  CC:  Chief Complaint  Patient presents with   Cysto    HPI: Eric Mckenzie is a 70 y.o. male with a personal history of BPH and gross hematuria, who presents today for a cystoscopy with RUS prior.   In 10/2018 he underwent a gross hematuria work-up that included CT urogram and cystoscopy. It revealed significant regrowth of friable prostatic mucosa following 2017 TURP.   He has a history of erectile dysfunction and is currently taking sildenafil.  05/31/2021 RUS revealed unremarkable kidneys and bladder with an enlarged prostate with a volume of 109 cc.   He also has a history of elevated PSA. His PSA trend is upwards, most recently as high as 8.6. Most recent PSA on 06/03/2021 had trended down to 6.1.   He is accompanied by his wife today.   PSA trend:  Component Prostate Specific Ag, Serum  Latest Ref Rng & Units 0.0 - 4.0 ng/mL  02/20/2018 5.8 (H)  08/21/2018 6.1 (H)  04/08/2019 6.1 (H)  03/31/2020 5.8 (H)  05/04/2021 8.6 (H)  06/03/2021 6.1 (H)     Vitals:   06/16/21 1040  BP: 132/79  Pulse: 85   NED. A&Ox3.   No respiratory distress   Abd soft, NT, ND Normal phallus with bilateral descended testicles  Cystoscopy Procedure Note  Patient identification was confirmed, informed consent was obtained, and patient was prepped using Betadine solution.  Lidocaine jelly was administered per urethral meatus.     Pre-Procedure: - Inspection reveals a normal caliber ureteral meatus.  Procedure: The flexible cystoscope was introduced without difficulty - No urethral strictures/lesions are present. - TURP defect of bladder neck with slightly irregular prostatic fossa - Bilateral ureteral orifices identified - Bladder mucosa  reveals no ulcers, tumors, or lesions - No bladder stones - No trabeculation  Retroflexion shows a large TURP defect with circumferential regrowth of his prostate   Post-Procedure: - Patient tolerated the procedure  well  Assessment/ Plan:  1. Elevated PSA   - PSA has trended back down will continue to follow this annually   2. Microscopic hematuria  - likely secondary to friable prostate and regrowth  - bladder otherwise unremarkable  - RUS showed no upper tract growth    Return in about 1 year (around 06/16/2022) for 1year follow up w/PSA,IPSS,PVR, UA.  I,Kailey Littlejohn,acting as a scribe for Vanna Scotland, MD.,have documented all relevant documentation on the behalf of Vanna Scotland, MD,as directed by  Vanna Scotland, MD while in the presence of Vanna Scotland, MD.  I have reviewed the above documentation for accuracy and completeness, and I agree with the above.   Vanna Scotland, MD

## 2021-06-16 ENCOUNTER — Other Ambulatory Visit: Payer: Self-pay

## 2021-06-16 ENCOUNTER — Encounter: Payer: Self-pay | Admitting: Urology

## 2021-06-16 ENCOUNTER — Ambulatory Visit: Payer: PPO | Admitting: Urology

## 2021-06-16 VITALS — BP 132/79 | HR 85 | Ht 72.0 in | Wt 276.0 lb

## 2021-06-16 DIAGNOSIS — R3129 Other microscopic hematuria: Secondary | ICD-10-CM

## 2021-06-16 DIAGNOSIS — N400 Enlarged prostate without lower urinary tract symptoms: Secondary | ICD-10-CM

## 2021-06-16 LAB — URINALYSIS, COMPLETE
Bilirubin, UA: NEGATIVE
Glucose, UA: NEGATIVE
Ketones, UA: NEGATIVE
Leukocytes,UA: NEGATIVE
Nitrite, UA: NEGATIVE
Protein,UA: NEGATIVE
Specific Gravity, UA: 1.015 (ref 1.005–1.030)
Urobilinogen, Ur: 0.2 mg/dL (ref 0.2–1.0)
pH, UA: 5.5 (ref 5.0–7.5)

## 2021-06-16 LAB — MICROSCOPIC EXAMINATION
Bacteria, UA: NONE SEEN
Epithelial Cells (non renal): NONE SEEN /hpf (ref 0–10)
WBC, UA: NONE SEEN /hpf (ref 0–5)

## 2021-08-19 DIAGNOSIS — E785 Hyperlipidemia, unspecified: Secondary | ICD-10-CM | POA: Diagnosis not present

## 2021-08-19 DIAGNOSIS — R7303 Prediabetes: Secondary | ICD-10-CM | POA: Diagnosis not present

## 2021-08-19 DIAGNOSIS — Z0000 Encounter for general adult medical examination without abnormal findings: Secondary | ICD-10-CM | POA: Diagnosis not present

## 2021-08-19 DIAGNOSIS — G4700 Insomnia, unspecified: Secondary | ICD-10-CM | POA: Diagnosis not present

## 2021-08-19 DIAGNOSIS — I10 Essential (primary) hypertension: Secondary | ICD-10-CM | POA: Diagnosis not present

## 2021-08-19 DIAGNOSIS — D693 Immune thrombocytopenic purpura: Secondary | ICD-10-CM | POA: Diagnosis not present

## 2021-08-19 DIAGNOSIS — G2581 Restless legs syndrome: Secondary | ICD-10-CM | POA: Diagnosis not present

## 2021-08-19 DIAGNOSIS — F439 Reaction to severe stress, unspecified: Secondary | ICD-10-CM | POA: Diagnosis not present

## 2021-08-19 DIAGNOSIS — Z23 Encounter for immunization: Secondary | ICD-10-CM | POA: Diagnosis not present

## 2021-08-20 ENCOUNTER — Other Ambulatory Visit: Payer: Self-pay | Admitting: Family Medicine

## 2021-08-20 DIAGNOSIS — Z0000 Encounter for general adult medical examination without abnormal findings: Secondary | ICD-10-CM

## 2021-09-03 ENCOUNTER — Ambulatory Visit
Admission: RE | Admit: 2021-09-03 | Discharge: 2021-09-03 | Disposition: A | Discharge status: Home / Self Care | Payer: PPO | Source: Ambulatory Visit | Attending: Family Medicine | Admitting: Family Medicine

## 2021-09-03 DIAGNOSIS — Z136 Encounter for screening for cardiovascular disorders: Secondary | ICD-10-CM | POA: Diagnosis not present

## 2021-09-03 DIAGNOSIS — Z0000 Encounter for general adult medical examination without abnormal findings: Secondary | ICD-10-CM

## 2021-09-03 DIAGNOSIS — Z87891 Personal history of nicotine dependence: Secondary | ICD-10-CM | POA: Diagnosis not present

## 2021-10-02 DIAGNOSIS — U071 COVID-19: Secondary | ICD-10-CM | POA: Diagnosis not present

## 2021-10-02 DIAGNOSIS — J069 Acute upper respiratory infection, unspecified: Secondary | ICD-10-CM | POA: Diagnosis not present

## 2021-10-10 DIAGNOSIS — U071 COVID-19: Secondary | ICD-10-CM | POA: Diagnosis not present

## 2021-10-10 DIAGNOSIS — J1282 Pneumonia due to coronavirus disease 2019: Secondary | ICD-10-CM | POA: Diagnosis not present

## 2021-10-30 DIAGNOSIS — M5441 Lumbago with sciatica, right side: Secondary | ICD-10-CM | POA: Diagnosis not present

## 2021-10-30 DIAGNOSIS — J40 Bronchitis, not specified as acute or chronic: Secondary | ICD-10-CM | POA: Diagnosis not present

## 2021-12-02 DIAGNOSIS — H5213 Myopia, bilateral: Secondary | ICD-10-CM | POA: Diagnosis not present

## 2021-12-02 DIAGNOSIS — H10413 Chronic giant papillary conjunctivitis, bilateral: Secondary | ICD-10-CM | POA: Diagnosis not present

## 2021-12-22 DIAGNOSIS — R051 Acute cough: Secondary | ICD-10-CM | POA: Diagnosis not present

## 2022-02-16 DIAGNOSIS — G4700 Insomnia, unspecified: Secondary | ICD-10-CM | POA: Diagnosis not present

## 2022-02-16 DIAGNOSIS — U099 Post covid-19 condition, unspecified: Secondary | ICD-10-CM | POA: Diagnosis not present

## 2022-02-16 DIAGNOSIS — E785 Hyperlipidemia, unspecified: Secondary | ICD-10-CM | POA: Diagnosis not present

## 2022-02-16 DIAGNOSIS — R002 Palpitations: Secondary | ICD-10-CM | POA: Diagnosis not present

## 2022-02-16 DIAGNOSIS — G2581 Restless legs syndrome: Secondary | ICD-10-CM | POA: Diagnosis not present

## 2022-02-16 DIAGNOSIS — D693 Immune thrombocytopenic purpura: Secondary | ICD-10-CM | POA: Diagnosis not present

## 2022-02-16 DIAGNOSIS — R053 Chronic cough: Secondary | ICD-10-CM | POA: Diagnosis not present

## 2022-02-16 DIAGNOSIS — M5441 Lumbago with sciatica, right side: Secondary | ICD-10-CM | POA: Diagnosis not present

## 2022-02-16 DIAGNOSIS — I10 Essential (primary) hypertension: Secondary | ICD-10-CM | POA: Diagnosis not present

## 2022-02-16 DIAGNOSIS — R7303 Prediabetes: Secondary | ICD-10-CM | POA: Diagnosis not present

## 2022-02-17 ENCOUNTER — Telehealth: Payer: Self-pay

## 2022-02-17 NOTE — Telephone Encounter (Signed)
NOTES SCANNED TO REFERRAL 

## 2022-03-04 ENCOUNTER — Ambulatory Visit: Payer: PPO | Admitting: Cardiology

## 2022-03-04 ENCOUNTER — Encounter: Payer: Self-pay | Admitting: Cardiology

## 2022-03-04 ENCOUNTER — Ambulatory Visit (INDEPENDENT_AMBULATORY_CARE_PROVIDER_SITE_OTHER): Payer: PPO

## 2022-03-04 VITALS — BP 132/82 | HR 64 | Ht 72.0 in | Wt 281.2 lb

## 2022-03-04 DIAGNOSIS — R002 Palpitations: Secondary | ICD-10-CM

## 2022-03-04 DIAGNOSIS — I10 Essential (primary) hypertension: Secondary | ICD-10-CM | POA: Diagnosis not present

## 2022-03-04 DIAGNOSIS — Z8249 Family history of ischemic heart disease and other diseases of the circulatory system: Secondary | ICD-10-CM | POA: Diagnosis not present

## 2022-03-04 DIAGNOSIS — Z9189 Other specified personal risk factors, not elsewhere classified: Secondary | ICD-10-CM | POA: Diagnosis not present

## 2022-03-04 DIAGNOSIS — E7800 Pure hypercholesterolemia, unspecified: Secondary | ICD-10-CM | POA: Diagnosis not present

## 2022-03-04 LAB — BASIC METABOLIC PANEL
BUN/Creatinine Ratio: 12 (ref 10–24)
BUN: 12 mg/dL (ref 8–27)
CO2: 22 mmol/L (ref 20–29)
Calcium: 9.8 mg/dL (ref 8.6–10.2)
Chloride: 101 mmol/L (ref 96–106)
Creatinine, Ser: 1.01 mg/dL (ref 0.76–1.27)
Glucose: 119 mg/dL — ABNORMAL HIGH (ref 70–99)
Potassium: 4.2 mmol/L (ref 3.5–5.2)
Sodium: 139 mmol/L (ref 134–144)
eGFR: 81 mL/min/{1.73_m2} (ref >59)

## 2022-03-04 NOTE — Progress Notes (Unsigned)
Enrolled patient for a 14 day Zio XT  monitor to be mailed to patients home  °

## 2022-03-04 NOTE — Progress Notes (Signed)
Cardiology CONSULT Note    Date:  03/04/2022   ID:  SHARAN ASHMEAD, DOB 07-Nov-1950, MRN [id number]  PCP:  Harlan Stains, MD  Cardiologist:  Fransico Him, MD   Chief Complaint  Patient presents with   New Patient (Initial Visit)    Evaluate for palpitations and cardiac risk factors for CAD    History of Present Illness:  Eric Mckenzie is a 71 y.o. male who is being seen today for the evaluation of palpitations at the request of Harlan Stains, MD.  This is a 71 year old male with a history of aortic atherosclerosis, chronic ITP, dyslipidemia, GERD, hypertension, morbid obesity, CVA who was referred for evaluation of palpitations.  He recently saw his PCP complaining of episodic palpitations for 20 to 30 minutes at a time about 2 times a month which has been going on for the past year.    Over the past 3 months they have been occurring more frequently on a daily basis lasting about 30-80min.  He says that it will just suddenly start and He says it feels like it is racing.  He says it will abruptly stop. PCP was also concerned because he has CRFs for CAD.  He has noticed some DOE attributed to his weight.  He denies any chest pain or pressure, PND, orthopnea, lower extremity edema, dizziness or syncope.  He has never smoked.  He has a strong family hx of aortic aneurysms  Past Medical History:  Diagnosis Date   Allergic rhinitis    Aortic atherosclerosis ([place])    COVID    Dyslipidemia    Enlarged prostate with lower urinary tract symptoms (LUTS) 10/27/2015   GERD (gastroesophageal reflux disease)    [name]    Hematoma    History of small bowel obstruction    HTN (hypertension)    Insomnia    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   Lacunar infarct, acute ([place])    Left sided sciatica    Morbid obesity due to excess calories ([facility])    Nonalcoholic steatohepatitis    Palpitations    Prediabetes    RLS (restless legs syndrome)    SBO (small bowel  obstruction) ([place]) 04/21/2017   Situational stress    Urinary retention 11/17/2015   Ventral hernia without obstruction or gangrene 05/11/2017   Added automatically from request for surgery [id number]    Past Surgical History:  Procedure Laterality Date   HERNIA REPAIR  2018   TRANSURETHRAL RESECTION OF PROSTATE  2017    Current Medications: Current Meds  Medication Sig   aspirin 325 MG buffered tablet Take 325 mg by mouth daily.   diazepam (VALIUM) 10 MG tablet SMARTSIG:1 Tablet(s) By Mouth   diclofenac (VOLTAREN) 75 MG EC tablet Take 1 tablet (75 mg total) by mouth 2 (two) times daily.   famotidine (PEPCID) 10 MG tablet Take 1 tablet by mouth as needed.   fish oil-omega-3 fatty acids 1000 MG capsule Take 2 g by mouth daily.   lisinopril-hydrochlorothiazide (ZESTORETIC) 20-12.5 MG tablet    loratadine (CLARITIN) 10 MG tablet    Multiple Vitamin (MULTIVITAMIN) capsule Take 1 capsule by mouth daily.   pravastatin (PRAVACHOL) 40 MG tablet Take 40 mg by mouth daily.   rOPINIRole (REQUIP) 1 MG tablet Take 1 mg by mouth 2 (two) times daily.   sildenafil (REVATIO) 20 MG tablet Take 1 tablet (20 mg total) by mouth as needed. Take 1-5 tabs as needed prior to intercourse   tiZANidine (ZANAFLEX) 4 MG  tablet Take 4 mg by mouth at bedtime.   zolpidem (AMBIEN) 10 MG tablet Take 10 mg by mouth as needed for sleep.     Allergies:   Amoxicillin, Prednisone, Tadalafil, and Tramadol hcl   Social History   Socioeconomic History   Marital status: Married    Spouse name: Not on file   Number of children: Not on file   Years of education: Not on file   Highest education level: Not on file  Occupational History   Not on file  Tobacco Use   Smoking status: Never   Smokeless tobacco: Never   Tobacco comments:    never used product  Substance and Sexual Activity   Alcohol use: No    Alcohol/week: 0.0 standard drinks   Drug use: No   Sexual activity: Not on file  Other Topics Concern   Not on  file  Social History Narrative   Not on file   Social Determinants of Health   Financial Resource Strain: Not on file  Food Insecurity: Not on file  Transportation Needs: Not on file  Physical Activity: Not on file  Stress: Not on file  Social Connections: Not on file     Family History:  The patient's family history includes Aortic aneurysm in his father, paternal grandfather, and paternal uncle; Cerebral aneurysm in his father; Hypertension in his mother.   ROS:   Please see the history of present illness.    ROS All other systems reviewed and are negative.      View : No data to display.             PHYSICAL EXAM:   VS:  BP 132/82   Pulse 64   Ht 6' (1.829 m)   Wt 281 lb 3.2 oz (127.6 kg)   SpO2 98%   BMI 38.14 kg/m    GEN: Well nourished, well developed, in no acute distress  HEENT: normal  Neck: no JVD, carotid bruits, or masses Cardiac: RRR; no murmurs, rubs, or gallops,no edema.  Intact distal pulses bilaterally.  Respiratory:  clear to auscultation bilaterally, normal work of breathing GI: soft, nontender, nondistended, + BS MS: no deformity or atrophy  Skin: warm and dry, no rash Neuro:  Alert and Oriented x 3, Strength and sensation are intact Psych: euthymic mood, full affect  Wt Readings from Last 3 Encounters:  03/04/22 281 lb 3.2 oz (127.6 kg)  06/16/21 276 lb (125.2 kg)  05/05/21 268 lb (121.6 kg)      Studies/Labs Reviewed:   EKG:  EKG is ordered today.  The ekg ordered today demonstrates NSR with no ST changes  Recent Labs: No results found for requested labs within last 8760 hours.   Lipid Panel No results found for: CHOL, TRIG, HDL, CHOLHDL, VLDL, LDLCALC, LDLDIRECT  Additional studies/ records that were reviewed today include:  Office visit notes from PCP    ASSESSMENT:    1. Palpitations   2. Multiple risk factors for coronary artery disease   3. Benign essential HTN   4. Pure hypercholesterolemia   5. Family history of  aortic aneurysm      PLAN:  In order of problems listed above:  Palpitations -Palpitations are very sporadic and only occur about 2 times a month for about 30 minutes at a time -I will get an event monitor for 30 days to evaluate for arrhythmias -Encouraged him to avoid alcohol and caffeine  2.  Risk factors for coronary artery disease -Check coronary artery  calcium score to assess future risk for heart disease  3.  Hypertension -BP is controlled on exam today -Continue prescription drug management with lisinopril HCT 20-12.5 mg daily with as needed refills  4.  Hyperlipidemia -I have personally reviewed and interpreted outside labs performed by patient's PCP which showed LDL 97, HDL 45, triglycerides 153 and ALT 32 on 08/19/2021 -If he is found to have coronary calcifications and his LDL goal would be less than 70 and his Pravachol will need to be adjusted -For now continue prescription drug management with Pravachol 40 mg daily with as needed refills  5.  Family history of aortic aneurysms -I will get an abdominal US to assess abdominal aorta -check thoracic aorta at time of coronary Ca score  Time Spent: 20 minutes total time of encounter, including 15 minutes spent in face-to-face patient care on the date of this encounter. This time includes coordination of care and counseling regarding above mentioned problem list. Remainder of non-face-to-face time involved reviewing chart documents/testing relevant to the patient encounter and documentation in the medical record. I have independently reviewed documentation from referring provider  Medication Adjustments/Labs and Tests Ordered: Current medicines are reviewed at length with the patient today.  Concerns regarding medicines are outlined above.  Medication changes, Labs and Tests ordered today are listed in the Patient Instructions below.  There are no Patient Instructions on file for this visit.   Signed, Fransico Him, MD   03/04/2022 9:02 AM    [facility] [address], [place], [place]  [address] Phone: [phone]; Fax: [phone]

## 2022-03-04 NOTE — Addendum Note (Signed)
Addended by: Macie Burows on: 03/04/2022 09:38 AM   Modules accepted: Orders

## 2022-03-04 NOTE — Patient Instructions (Signed)
Medication Instructions:  Your physician recommends that you continue on your current medications as directed. Please refer to the Current Medication list given to you today.  *If you need a refill on your cardiac medications before your next appointment, please call your pharmacy*   Lab Work: TODAY: BMP If you have labs (blood work) drawn today and your tests are completely normal, you will receive your results only by: MyChart Message (if you have MyChart) OR A paper copy in the mail If you have any lab test that is abnormal or we need to change your treatment, we will call you to review the results.   Testing/Procedures: Your physician has requested that you have a Chest CT with Coronary Calcium Score.  Your physician has requested that you wear a 14 day heart monitor.    Follow-Up:As needed At [facility], you and your health needs are our priority.  As part of our continuing mission to provide you with exceptional heart care, we have created designated Provider Care Teams.  These Care Teams include your primary Cardiologist (physician) and Advanced Practice Providers (APPs -  Physician Assistants and Nurse Practitioners) who all work together to provide you with the care you need, when you need it.   Provider:  Armanda Magic, MD1}    Other Instructions Christena Deem- Long Term Monitor Instructions  Your physician has requested you wear a ZIO patch monitor for 14 days.  This is a single patch monitor. [employer] supplies one patch monitor per enrollment. Additional stickers are not available. Please do not apply patch if you will be having a Nuclear Stress Test,  Echocardiogram, Cardiac CT, MRI, or Chest Xray during the period you would be wearing the  monitor. The patch cannot be worn during these tests. You cannot remove and re-apply the  ZIO XT patch monitor.  Your ZIO patch monitor will be mailed 3 day USPS to your address on file. It may take 3-5 days  to receive your monitor  after you have been enrolled.  Once you have received your monitor, please review the enclosed instructions. Your monitor  has already been registered assigning a specific monitor serial # to you.  Billing and Patient Assistance Program Information  We have supplied [employer] with any of your insurance information on file for billing purposes. [employer] offers a sliding scale Patient Assistance Program for patients that do not have  insurance, or whose insurance does not completely cover the cost of the ZIO monitor.  You must apply for the Patient Assistance Program to qualify for this discounted rate.  To apply, please call [employer] at [phone], select option 4, select option 2, ask to apply for  Patient Assistance Program. Meredeth Ide will ask your household income, and how many people  are in your household. They will quote your out-of-pocket cost based on that information.  [employer] will also be able to set up a 66-month, interest-free payment plan if needed.  Applying the monitor   Shave hair from upper left chest.  Hold abrader disc by orange tab. Rub abrader in 40 strokes over the upper left chest as  indicated in your monitor instructions.  Clean area with 4 enclosed alcohol pads. Let dry.  Apply patch as indicated in monitor instructions. Patch will be placed under collarbone on left  side of chest with arrow pointing upward.  Rub patch adhesive wings for 2 minutes. Remove white label marked "1". Remove the white  label marked "2". Rub patch adhesive wings for 2 additional  minutes.  While looking in a mirror, press and release button in center of patch. A small green light will  flash 3-4 times. This will be your only indicator that the monitor has been turned on.  Do not shower for the first 24 hours. You may shower after the first 24 hours.  Press the button if you feel a symptom. You will hear a small click. Record Date, Time and  Symptom in the Patient Logbook.  When you are  ready to remove the patch, follow instructions on the last 2 pages of Patient  Logbook. Stick patch monitor onto the last page of Patient Logbook.  Place Patient Logbook in the blue and white box. Use locking tab on box and tape box closed  securely. The blue and white box has prepaid postage on it. Please place it in the mailbox as  soon as possible. Your physician should have your test results approximately 7 days after the  monitor has been mailed back to [facility].  Call [facility] at [phone] if you have questions regarding  your ZIO XT patch monitor. Call them immediately if you see an orange light blinking on your  monitor.  If your monitor falls off in less than 4 days, contact our Monitor department at [phone].  If your monitor becomes loose or falls off after 4 days call [employer] at [phone] for  suggestions on securing your monitor   Important Information About Sugar

## 2022-03-05 ENCOUNTER — Ambulatory Visit (HOSPITAL_BASED_OUTPATIENT_CLINIC_OR_DEPARTMENT_OTHER): Admission: RE | Admit: 2022-03-05 | Payer: PPO | Source: Ambulatory Visit

## 2022-03-08 ENCOUNTER — Ambulatory Visit (HOSPITAL_BASED_OUTPATIENT_CLINIC_OR_DEPARTMENT_OTHER)
Admission: RE | Admit: 2022-03-08 | Discharge: 2022-03-08 | Disposition: A | Discharge status: Home / Self Care | Payer: PPO | Source: Ambulatory Visit | Attending: Cardiology | Admitting: Cardiology

## 2022-03-08 DIAGNOSIS — R002 Palpitations: Secondary | ICD-10-CM | POA: Insufficient documentation

## 2022-03-08 DIAGNOSIS — Z9189 Other specified personal risk factors, not elsewhere classified: Secondary | ICD-10-CM | POA: Diagnosis not present

## 2022-03-08 DIAGNOSIS — Z8249 Family history of ischemic heart disease and other diseases of the circulatory system: Secondary | ICD-10-CM | POA: Diagnosis not present

## 2022-03-08 DIAGNOSIS — I10 Essential (primary) hypertension: Secondary | ICD-10-CM | POA: Insufficient documentation

## 2022-03-08 DIAGNOSIS — E7800 Pure hypercholesterolemia, unspecified: Secondary | ICD-10-CM | POA: Insufficient documentation

## 2022-03-08 DIAGNOSIS — I700 Atherosclerosis of aorta: Secondary | ICD-10-CM | POA: Diagnosis not present

## 2022-03-08 MED ORDER — IOHEXOL 350 MG/ML SOLN
100.0000 mL | Freq: Once | INTRAVENOUS | Status: AC | PRN
  Administered 2022-03-08: 100 mL via INTRAVENOUS

## 2022-03-09 ENCOUNTER — Encounter: Payer: Self-pay | Admitting: Cardiology

## 2022-03-09 DIAGNOSIS — I77810 Thoracic aortic ectasia: Secondary | ICD-10-CM | POA: Insufficient documentation

## 2022-03-11 ENCOUNTER — Telehealth: Payer: Self-pay | Admitting: *Deleted

## 2022-03-11 DIAGNOSIS — I77819 Aortic ectasia, unspecified site: Secondary | ICD-10-CM

## 2022-03-11 DIAGNOSIS — Z8249 Family history of ischemic heart disease and other diseases of the circulatory system: Secondary | ICD-10-CM

## 2022-03-11 NOTE — Telephone Encounter (Signed)
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  Please get 2D echo to assess further.  Repeat chest CTA gated in 1 year for follow-up   Rule out bicuspid aortic valve in the setting of aortic dilatation.  Also get a baseline on echo and if it is in agreement with the CT findings we can follow him with yearly echo   Pt is aware of order and that he will be contacted to be scheduled.

## 2022-03-25 ENCOUNTER — Ambulatory Visit (HOSPITAL_COMMUNITY): Payer: PPO | Attending: Cardiology

## 2022-03-25 DIAGNOSIS — I77819 Aortic ectasia, unspecified site: Secondary | ICD-10-CM | POA: Diagnosis not present

## 2022-03-25 DIAGNOSIS — Z8249 Family history of ischemic heart disease and other diseases of the circulatory system: Secondary | ICD-10-CM | POA: Diagnosis not present

## 2022-03-25 LAB — ECHOCARDIOGRAM COMPLETE
Area-P 1/2: 3.37 cm2
S' Lateral: 2.7 cm

## 2022-03-25 MED ORDER — PERFLUTREN LIPID MICROSPHERE
1.0000 mL | INTRAVENOUS | Status: AC | PRN
  Administered 2022-03-25: 1 mL via INTRAVENOUS

## 2022-03-28 ENCOUNTER — Telehealth: Payer: Self-pay

## 2022-03-28 DIAGNOSIS — I517 Cardiomegaly: Secondary | ICD-10-CM

## 2022-03-28 DIAGNOSIS — I77810 Thoracic aortic ectasia: Secondary | ICD-10-CM

## 2022-03-28 NOTE — Telephone Encounter (Signed)
Patient reports that he has had a sleep study done in the past and was diagnosed with sleep apnea. He went on CPAP and did not tolerate it. States that he slept less on the machine than he did prior. He is not interested in having another sleep study at this time.

## 2022-03-29 ENCOUNTER — Telehealth: Payer: Self-pay | Admitting: *Deleted

## 2022-03-29 ENCOUNTER — Telehealth: Payer: Self-pay

## 2022-03-29 DIAGNOSIS — R002 Palpitations: Secondary | ICD-10-CM | POA: Diagnosis not present

## 2022-03-29 MED ORDER — METOPROLOL SUCCINATE ER 25 MG PO TB24: 25.0000 mg | ORAL_TABLET | Freq: Every day | ORAL | 3 refills | Status: DC

## 2022-03-30 NOTE — Telephone Encounter (Signed)
Pt came by the office today for [place] set ups. Pt agreeable to plan of care to not open the box until he has been called with the PIN#. Pt agreeable to signed waiver.

## 2022-03-30 NOTE — Telephone Encounter (Signed)
Pt called  back and said ok to leave PIN # on his vm if he does not answer

## 2022-04-07 NOTE — Telephone Encounter (Signed)
Per pt ok to leave vm with the PIN #. Left message PIN # [id number]. Will notate the pt to do sleep study between tonight and over the weekend. Left message if the pt cannot do this, to please call and let me know if he can do the sleep study next week.   Called and made the patient aware that he may proceed with the [facility] Sleep Study. PIN # provided to the patient. Patient made aware that he will be contacted after the test has been read with the results and any recommendations. Patient verbalized understanding and thanked me for the call.

## 2022-04-07 NOTE — Telephone Encounter (Signed)
Prior Authorization for [facility] sent to [facility] via web portal. APPROVED-[employer] READ- AUTH # [id number]/04/22--07-03-22

## 2022-04-10 ENCOUNTER — Encounter (INDEPENDENT_AMBULATORY_CARE_PROVIDER_SITE_OTHER): Payer: PPO | Admitting: Cardiology

## 2022-04-10 DIAGNOSIS — G4733 Obstructive sleep apnea (adult) (pediatric): Secondary | ICD-10-CM

## 2022-04-11 NOTE — Procedures (Signed)
   SLEEP STUDY REPORT Patient Information Study Date: 04/10/22 Patient Name: Eric Mckenzie Patient ID: [id number] Birth Date: April 12, 2051 Age: 71 Gender: Male BMI: 40.4 (W=282 lb, H=5' 10'') Neck Circ.: 17 '' Referring Physician: Armanda Magic, MD  TEST DESCRIPTION: Home sleep apnea testing was completed using the WatchPat, a Type 1 device, utilizing peripheral arterial tonometry (PAT), chest movement, actigraphy, pulse oximetry, pulse rate, body position and snore. AHI was calculated with apnea and hypopnea using valid sleep time as the denominator. RDI includes apneas, hypopneas, and RERAs. The data acquired and the scoring of sleep and all associated events were performed in accordance with the recommended standards and specifications as outlined in the AASM Manual for the Scoring of Sleep and Associated Events 2.2.0 (2015).  FINDINGS: 1. Mild Obstructive Sleep Apnea with AHI 8.5/hr. 2. No Central Sleep Apnea with pAHIc 2.1/hr. 3. Oxygen desaturations as low as 88%. 4. Mild snoring was present. O2 sats were < 88% for 0 min. 5. Total sleep time was 4 hrs and 13 min. 6. 32.2% of total sleep time was spent in REM sleep. 7. Prolonged sleep onset latency at 37 min. 8. Normal REM sleep onset latency at 76 min. 9. Total awakenings were 14.  DIAGNOSIS: Mild Obstructive Sleep Apnea (G47.33)  RECOMMENDATIONS: 1. Clinical correlation of these findings is necessary. The decision to treat obstructive sleep apnea (OSA) is usually based on the presence of apnea symptoms or the presence of associated medical conditions such as Hypertension, Congestive Heart Failure, Atrial Fibrillation or Obesity. The most common symptoms of OSA are snoring, gasping for breath while sleeping, daytime sleepiness and fatigue.  2. Initiating apnea therapy is recommended given the presence of symptoms and/or associated conditions. Recommend proceeding with one of the following:   a. Auto-CPAP therapy with a  pressure range of 5-20cm H2O.   b. An oral appliance (OA) that can be obtained from certain dentists with expertise in sleep medicine. These are primarily of use in non-obese patients with mild and moderate disease.   c. An ENT consultation which may be useful to look for specific causes of obstruction and possible treatment options.   d. If patient is intolerant to PAP therapy, consider referral to ENT for evaluation for hypoglossal nerve stimulator.  3. Close follow-up is necessary to ensure success with CPAP or oral appliance therapy for maximum benefit .  4. A follow-up oximetry study on CPAP is recommended to assess the adequacy of therapy and determine the need for supplemental oxygen or the potential need for Bi-level therapy. An arterial blood gas to determine the adequacy of baseline ventilation and oxygenation should also be considered.  5. Healthy sleep recommendations include: adequate nightly sleep (normal 7-9 hrs/night), avoidance of caffeine after noon and alcohol near bedtime, and maintaining a sleep environment that is cool, dark and quiet.  6. Weight loss for overweight patients is recommended. Even modest amounts of weight loss can significantly improve the severity of sleep apnea.  7. Snoring recommendations include: weight loss where appropriate, side sleeping, and avoidance of alcohol before bed.  8. Operation of motor vehicle should be avoided when sleepy.  Signature: Electronically Signed: 04/12/22 Armanda Magic, MD; [facility]; Diplomat, American Board of Sleep Medicine

## 2022-04-12 ENCOUNTER — Ambulatory Visit: Payer: PPO

## 2022-04-12 DIAGNOSIS — I77810 Thoracic aortic ectasia: Secondary | ICD-10-CM

## 2022-04-12 DIAGNOSIS — I517 Cardiomegaly: Secondary | ICD-10-CM

## 2022-04-18 MED ORDER — DILTIAZEM HCL ER COATED BEADS 180 MG PO CP24: 180.0000 mg | ORAL_CAPSULE | Freq: Every day | ORAL | 3 refills | Status: DC

## 2022-04-18 NOTE — Telephone Encounter (Signed)
Called patient who reports that he has not felt good since starting metoprolol. He states that he started it about two weeks ago and since the last week he has noticed swelling in his feet and ankles along with some aching and burning in his legs. He states that in the evenings he gets tingling down his arms to his fingertips. He has noticed more frequent headaches. Reports feeling much more fatigued and winded easier. He reports not sexual desire. States that blood pressure has been averaging 140s/80s. He does report that it has helped with his heart racing. It has almost completely resolved. He has only had a couple of episodes that did not last long since starting metoprolol. He is concerned about is not sure if his meds should be changed.

## 2022-04-26 ENCOUNTER — Telehealth: Payer: Self-pay | Admitting: *Deleted

## 2022-04-26 DIAGNOSIS — G4733 Obstructive sleep apnea (adult) (pediatric): Secondary | ICD-10-CM

## 2022-04-26 NOTE — Telephone Encounter (Signed)
CC'd Chart Routing History  Routing History  From: Quintella Reichert, MD On: 04/11/2022 08:44 PM  To: [facility] ([facility])  Priority: Routine  Routing Comments:  Please let patient know that they have sleep apnea and recommend treating with CPAP.  Please order an auto CPAP from 4-15cm H2O with heated humidity and mask of choice.  Order overnight pulse ox on CPAP.  Followup with me in 6 weeks.

## 2022-04-27 NOTE — Telephone Encounter (Signed)
Called results lmtcb. 

## 2022-04-27 NOTE — Progress Notes (Unsigned)
Office Visit    Patient Name: Eric Mckenzie Date of Encounter: 04/28/2022  PCP:  Harlan Stains, MD   [facility]  Cardiologist:  Fransico Him, MD  Advanced Practice Provider:  No care team member to display Electrophysiologist:  None   Chief Complaint    Eric Mckenzie is a 71 y.o. male with a past medical history significant for prediabetes, GERD, CVA, dyslipidemia, and palpitations presents today for follow-up visit.  He was last seen in the office 03/04/2022.  He recently was seen by his PCP having episodic palpitations for 20 to 30 minutes at a time about 2 times a month which has been going on for the past year.  Over the last 3 months they have occurred more frequently on a daily basis lasting about 30 to 60 minutes.  He stated that they did start suddenly and he felt like his heart was racing.  It was then abruptly stopped.  PCP was concerned because he has CRF's for CAD.  He is also noticed some DOE attributed to his weight.  He denied any chest pressure/pain.  He has never smoked.  He has a strong family history of aortic aneurysms.   Today he states that he was taken off metoprolol and started on Cardizem for his palpitations.  He has had lower extremity edema with both medications.  We will try carvedilol today at the lowest dose in hopes to suppress his palpitations but also not create lower extremity edema.  He is off of his Cardizem at the moment.  His blood pressure slightly elevated in the clinic today however, at home he states is usually 134/78.  We provided him with a blood pressure log for him to keep track.  We have ordered a BMP to check his kidney function since he is experiencing more lower extremity edema.  I explained to the patient that usually these medications are not particularly nephrotoxic but we will check this to be sure.  Otherwise, he is doing well.  Reports no shortness of breath nor dyspnea on exertion. Reports no chest pain,  pressure, or tightness. No edema, orthopnea, PND. Reports no palpitations.    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   Lacunar infarct, acute ([place])    Left sided sciatica    Morbid obesity due to excess calories (HCC)    Nonalcoholic steatohepatitis    Palpitations    Prediabetes    RLS (restless legs syndrome)    SBO (small bowel obstruction) ([place]) 04/21/2017   Situational stress    Urinary retention 11/17/2015   Ventral hernia without obstruction or gangrene 05/11/2017   Added automatically from request for surgery [id number]   Past Surgical History:  Procedure Laterality Date   HERNIA REPAIR  2018   TRANSURETHRAL RESECTION OF PROSTATE  2017    Allergies  Allergies  Allergen Reactions   Amoxicillin     Other reaction(s): rash?   Prednisone Other (See Comments)  Very bad mood swings   Tadalafil     Other reaction(s): ha   Tramadol Hcl     Other reaction(s): difficulty urinating     EKGs/Labs/Other Studies Reviewed:   The following studies were reviewed today:  Echocardiogram 03/25/2022  IMPRESSIONS     1. Left ventricular ejection fraction, by estimation, is 60 to 65%. The  left ventricle has normal function. The left ventricle has no regional  wall motion abnormalities. Left ventricular diastolic parameters were  normal.   2. Right ventricular systolic function is normal. The right ventricular  size is mildly enlarged. Tricuspid regurgitation signal is inadequate for  assessing PA pressure.   3. Right atrial size was  mildly dilated.   4. The mitral valve is grossly normal. No evidence of mitral valve  regurgitation. No evidence of mitral stenosis.   5. The aortic valve is tricuspid. Aortic valve regurgitation is not  visualized. No aortic stenosis is present.   6. Aortic dilatation noted. There is mild dilatation of the ascending  aorta, measuring 40 mm.   Comparison(s): No prior Echocardiogram. CT 03/08/22 Ascending aorta 39 mm.  Monitor 03/29/2022  Predominant rhythm was normal sinus rhythm with an average heart rate of 80 bpm and ranged from 47 to 148 bpm. There were 6 episodes of SVT with the longest episode lasting 19 beats and the fastest episode occurring at 182 bpm. It appears to be paroxysmal atrial tachycardia. Occasional PACs Occasional PVCs, ventricular couplets, ventricular salvos with PVC load less than 1%  EKG:  EKG is not ordered today.   Recent Labs: 03/04/2022: BUN 12; Creatinine, Ser 1.01; Potassium 4.2; Sodium 139  Recent Lipid Panel No results found for: "CHOL", "TRIG", "HDL", "CHOLHDL", "VLDL", "LDLCALC", "LDLDIRECT"  Home Medications   Current Meds  Medication Sig   aspirin 325 MG buffered tablet Take 325 mg by mouth daily.   budesonide-formoterol (SYMBICORT) 160-4.5 MCG/ACT inhaler Inhale 2 puffs into the lungs as needed for shortness of breath.   carvedilol (COREG) 3.125 MG tablet Take 1 tablet (3.125 mg total) by mouth 2 (two) times daily.   diazepam (VALIUM) 10 MG tablet SMARTSIG:1 Tablet(s) By Mouth   diclofenac (VOLTAREN) 75 MG EC tablet Take 1 tablet (75 mg total) by mouth 2 (two) times daily.   famotidine (PEPCID) 10 MG tablet Take 1 tablet by mouth as needed.   fish oil-omega-3 fatty acids 1000 MG capsule Take 2 g by mouth daily.   lisinopril-hydrochlorothiazide (ZESTORETIC) 20-12.5 MG tablet    loratadine (CLARITIN) 10 MG tablet    Multiple Vitamin (MULTIVITAMIN) capsule Take 1 capsule by mouth daily.   pravastatin (PRAVACHOL) 40 MG tablet Take 40 mg by mouth  daily.   rOPINIRole (REQUIP) 1 MG tablet Take 1 mg by mouth 2 (two) times daily.   sildenafil (REVATIO) 20 MG tablet Take 1 tablet (20 mg total) by mouth as needed. Take 1-5 tabs as needed prior to intercourse   tiZANidine (ZANAFLEX) 4 MG tablet Take 4 mg by mouth at bedtime.   zolpidem (AMBIEN) 10 MG tablet Take 10 mg by mouth as needed for sleep.    [DISCONTINUED] diltiazem (CARDIZEM CD) 180 MG 24 hr capsule Take 1 capsule (180 mg total) by mouth daily.     Review of Systems      All other systems reviewed and are otherwise negative except as noted above.  Physical Exam    VS:  BP (!) 142/82   Pulse 74   Ht 6' (1.829 m)   [employer]  284 lb (128.8 kg)   SpO2 94%   BMI 38.52 kg/m  , BMI Body mass index is 38.52 kg/m.  Wt Readings from Last 3 Encounters:  04/28/22 284 lb (128.8 kg)  03/04/22 281 lb 3.2 oz (127.6 kg)  06/16/21 276 lb (125.2 kg)     GEN: Well nourished, well developed, in no acute distress. HEENT: normal. Neck: Supple, no JVD, carotid bruits, or masses. Cardiac: RRR, no murmurs, rubs, or gallops. No clubbing, cyanosis, edema.  Radials/PT 2+ and equal bilaterally.  Respiratory:  Respirations regular and unlabored, clear to auscultation bilaterally. GI: Soft, nontender, nondistended. MS: No deformity or atrophy. Skin: Warm and dry, no rash. Neuro:  Strength and sensation are intact. Psych: Normal affect.  Assessment & Plan    Palpitations with recent cardiac monitor -Monitor showed 6 episodes of SVT, nonsustained.  Occasional PACs and occasional PVCs.  Mostly normal sinus rhythm with average heart rate of 80 bpm. -He has not been able to tolerate metoprolol or Cardizem due to lower extremity edema -We will try carvedilol 3.125 mg today to see if we will be able to suppress his palpitations and keeps his lower extremity from swelling -BMP today  Risk factors for CAD -Continue GDMT with aspirin 325 mg, lisinopril/HCTZ 20-12-1/2 mg, pravastatin 40 mg -Recently  started on Symbicort and he states this has improved his shortness of breath significantly.  Hypertension -Slightly elevated today -at home it is usually 134/78  Hyperlipidemia -Will require updated Lipid panel  Family history of aortic aneurysms -Echocardiogram with normal heart function 60 to 65%, mild right ventricular enlargement, mild right atrial enlargement, no valvular disease, the ascending aorta mildly dilated at 40 mm. -Repeat 2D echo 03/2023 -CTA showed ascending thoracic aortic dilatation at 3.9 cm, repeat study in 1 year (03/2023)     Disposition: Follow up 3 months with Armanda Magic, MD or APP.  Signed, Sharlene Dory, PA-C 04/28/2022, 9:43 AM [facility]

## 2022-04-28 ENCOUNTER — Encounter: Payer: Self-pay | Admitting: Physician Assistant

## 2022-04-28 ENCOUNTER — Ambulatory Visit (INDEPENDENT_AMBULATORY_CARE_PROVIDER_SITE_OTHER): Payer: PPO | Admitting: Physician Assistant

## 2022-04-28 VITALS — BP 142/82 | HR 74 | Ht 72.0 in | Wt 284.0 lb

## 2022-04-28 DIAGNOSIS — E782 Mixed hyperlipidemia: Secondary | ICD-10-CM

## 2022-04-28 DIAGNOSIS — I10 Essential (primary) hypertension: Secondary | ICD-10-CM

## 2022-04-28 DIAGNOSIS — Z8249 Family history of ischemic heart disease and other diseases of the circulatory system: Secondary | ICD-10-CM | POA: Diagnosis not present

## 2022-04-28 DIAGNOSIS — I77810 Thoracic aortic ectasia: Secondary | ICD-10-CM | POA: Diagnosis not present

## 2022-04-28 DIAGNOSIS — Z9189 Other specified personal risk factors, not elsewhere classified: Secondary | ICD-10-CM | POA: Diagnosis not present

## 2022-04-28 DIAGNOSIS — R002 Palpitations: Secondary | ICD-10-CM

## 2022-04-28 LAB — BASIC METABOLIC PANEL
BUN/Creatinine Ratio: 12 (ref 10–24)
BUN: 11 mg/dL (ref 8–27)
CO2: 22 mmol/L (ref 20–29)
Calcium: 9.1 mg/dL (ref 8.6–10.2)
Chloride: 101 mmol/L (ref 96–106)
Creatinine, Ser: 0.92 mg/dL (ref 0.76–1.27)
Glucose: 112 mg/dL — ABNORMAL HIGH (ref 70–99)
Potassium: 4 mmol/L (ref 3.5–5.2)
Sodium: 139 mmol/L (ref 134–144)
eGFR: 90 mL/min/{1.73_m2} (ref >59)

## 2022-04-28 MED ORDER — CARVEDILOL 3.125 MG PO TABS: 3.1250 mg | ORAL_TABLET | Freq: Two times a day (BID) | ORAL | 1 refills | Status: DC

## 2022-04-28 NOTE — Patient Instructions (Signed)
Medication Instructions:  STOP Cardizem  START Coreg 3.125mg  take 1 tablet twice a day  *If you need a refill on your cardiac medications before your next appointment, please call your pharmacy*   Lab Work: TODAY-BMET If you have labs (blood work) drawn today and your tests are completely normal, you will receive your results only by: MyChart Message (if you have MyChart) OR A paper copy in the mail If you have any lab test that is abnormal or we need to change your treatment, we will call you to review the results.   Testing/Procedures: None Ordered   Follow-Up: At [facility], you and your health needs are our priority.  As part of our continuing mission to provide you with exceptional heart care, we have created designated Provider Care Teams.  These Care Teams include your primary Cardiologist (physician) and Advanced Practice Providers (APPs -  Physician Assistants and Nurse Practitioners) who all work together to provide you with the care you need, when you need it.  We recommend signing up for the patient portal called "MyChart".  Sign up information is provided on this After Visit Summary.  MyChart is used to connect with patients for Virtual Visits (Telemedicine).  Patients are able to view lab/test results, encounter notes, upcoming appointments, etc.  Non-urgent messages can be sent to your provider as well.   To learn more about what you can do with MyChart, go to [email or web address].    Your next appointment:   3 month(s)  The format for your next appointment:   In Person  Provider:   Armanda Magic, MD  or Jari Favre, PA-C       Other Instructions CONTINUE TO MONITOR BLOOD PRESSURE AT HOME   Important Information About Sugar

## 2022-05-17 ENCOUNTER — Telehealth: Payer: Self-pay | Admitting: *Deleted

## 2022-05-17 NOTE — Telephone Encounter (Signed)
-----   Message from Gaynelle Cage, CMA sent at 05/03/2022 10:19 AM EDT -----  ----- Message ----- From: Quintella Reichert, MD Sent: 04/11/2022   8:44 PM EDT To: Cv Div Sleep Studies  Please let patient know that they have sleep apnea and recommend treating with CPAP.  Please order an auto CPAP from 4-15cm H2O with heated humidity and mask of choice.  Order overnight pulse ox on CPAP.  Followup with me in 6 weeks.

## 2022-05-17 NOTE — Telephone Encounter (Signed)
The patient has been notified of the result and verbalized understanding.  All questions (if any) were answered. Latrelle Dodrill, CMA 05/17/2022 5:30 PM    Patient states he has been on cpap before for 2 years and he could not sleep with it. Patient states his sleep is very important to him and he wasted a lot of money on buying the machine and the equipment. Patient declines to go back on cpap.

## 2022-06-16 ENCOUNTER — Other Ambulatory Visit: Payer: PPO

## 2022-06-16 DIAGNOSIS — N400 Enlarged prostate without lower urinary tract symptoms: Secondary | ICD-10-CM | POA: Diagnosis not present

## 2022-06-17 LAB — PSA: Prostate Specific Ag, Serum: 6.6 ng/mL — ABNORMAL HIGH (ref 0.0–4.0)

## 2022-06-21 ENCOUNTER — Encounter: Payer: Self-pay | Admitting: Urology

## 2022-06-21 ENCOUNTER — Ambulatory Visit: Payer: PPO | Admitting: Urology

## 2022-06-21 VITALS — Ht 72.0 in

## 2022-06-21 DIAGNOSIS — N400 Enlarged prostate without lower urinary tract symptoms: Secondary | ICD-10-CM | POA: Diagnosis not present

## 2022-06-21 DIAGNOSIS — N5203 Combined arterial insufficiency and corporo-venous occlusive erectile dysfunction: Secondary | ICD-10-CM | POA: Diagnosis not present

## 2022-06-21 DIAGNOSIS — R3129 Other microscopic hematuria: Secondary | ICD-10-CM | POA: Diagnosis not present

## 2022-06-21 LAB — URINALYSIS, COMPLETE
Bilirubin, UA: NEGATIVE
Glucose, UA: NEGATIVE
Ketones, UA: NEGATIVE
Leukocytes,UA: NEGATIVE
Nitrite, UA: NEGATIVE
Protein,UA: NEGATIVE
RBC, UA: NEGATIVE
Specific Gravity, UA: 1.015 (ref 1.005–1.030)
Urobilinogen, Ur: 0.2 mg/dL (ref 0.2–1.0)
pH, UA: 5 (ref 5.0–7.5)

## 2022-06-21 LAB — MICROSCOPIC EXAMINATION: Bacteria, UA: NONE SEEN

## 2022-06-21 LAB — BLADDER SCAN AMB NON-IMAGING

## 2022-06-21 MED ORDER — TADALAFIL 20 MG PO TABS: 20.0000 mg | ORAL_TABLET | Freq: Every day | ORAL | 11 refills | Status: DC | PRN

## 2022-06-21 NOTE — Progress Notes (Signed)
06/21/2022 8:16 AM   Eric Mckenzie [birth year] [id number]  Referring provider: Laurann Montana, MD [address] [place],  [place] [address]  Chief Complaint  Patient presents with   Benign Prostatic Hypertrophy   Hematuria    HPI: 71 year old male followed for multiple GU histories including personal history of BPH status post TURP in 2017, gross hematuria, erectile dysfunction and history of elevated PSA.  Please see previous notes for details.  His most recent PSA is 6.6 which is up slightly from a year ago at 6.1 but down from his peak of 8.6.  He tends to run in the 5-7 range.  In terms of urinary symptoms, he has no complaints today.  IPSS as below.  He is on no BPH medications at this time.  He has tried sildenafil at max dose and is having limited benefit.  He wonders about alternatives.  He does not recall trying tadalafil in the past but it is listed as headaches as a side effect.  He does not recall this and like to try it again.  He does also mention that he had a lot of social stressors, dealing with having to care for his father-in-law with dementia.  No gross hematuria or dysuria.     IPSS     Row Name 06/21/22 1314         International Prostate Symptom Score   How often have you had the sensation of not emptying your bladder? Less than half the time     How often have you had to urinate less than every two hours? Less than 1 in 5 times     How often have you found you stopped and started again several times when you urinated? Not at All     How often have you found it difficult to postpone urination? Less than 1 in 5 times     How often have you had a weak urinary stream? Less than 1 in 5 times     How often have you had to strain to start urination? Not at All     How many times did you typically get up at night to urinate? 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   Lacunar infarct, acute (HCC)    Left sided sciatica    Morbid obesity due to excess calories (HCC)    Nonalcoholic steatohepatitis    Palpitations    Prediabetes    RLS (restless legs syndrome)    SBO (small bowel obstruction) (HCC) 04/21/2017   Situational stress    Urinary retention 11/17/2015   Ventral hernia without obstruction or gangrene 05/11/2017   Added automatically from request for surgery [id number]    Surgical History: Past Surgical History:  Procedure Laterality Date   HERNIA REPAIR  2018  TRANSURETHRAL RESECTION OF PROSTATE  2017    Home Medications:  Allergies as of 06/21/2022       Reactions   Amoxicillin    Other reaction(s): rash?   Prednisone Other (See Comments)   Very bad mood swings   Tadalafil    Other reaction(s): ha   Tramadol Hcl    Other reaction(s): difficulty urinating        Medication List        Accurate as of June 21, 2022 11:59 PM. If you have any questions, ask your nurse or doctor.          STOP taking these medications    sildenafil 20 MG tablet Commonly known as: Revatio Stopped by: Vanna Scotland, MD       TAKE these medications    aspirin 325 MG buffered tablet Take 325 mg by mouth daily.   carvedilol 3.125 MG tablet Commonly known as: COREG Take 1 tablet (3.125 mg total) by mouth 2 (two) times daily.   diazepam 10 MG tablet Commonly known as: VALIUM SMARTSIG:1 Tablet(s) By Mouth   diclofenac 75 MG EC tablet Commonly known as: VOLTAREN Take 1  tablet (75 mg total) by mouth 2 (two) times daily.   famotidine 10 MG tablet Commonly known as: PEPCID Take 1 tablet by mouth as needed.   fish oil-omega-3 fatty acids 1000 MG capsule Take 2 g by mouth daily.   lisinopril-hydrochlorothiazide 20-12.5 MG tablet Commonly known as: ZESTORETIC   loratadine 10 MG tablet Commonly known as: CLARITIN   multivitamin capsule Take 1 capsule by mouth daily.   pravastatin 40 MG tablet Commonly known as: PRAVACHOL Take 40 mg by mouth daily.   rOPINIRole 1 MG tablet Commonly known as: REQUIP Take 1 mg by mouth 2 (two) times daily.   Symbicort 160-4.5 MCG/ACT inhaler Generic drug: budesonide-formoterol Inhale 2 puffs into the lungs as needed for shortness of breath.   tadalafil 20 MG tablet Commonly known as: CIALIS Take 1 tablet (20 mg total) by mouth daily as needed for erectile dysfunction. Started by: Vanna Scotland, MD   tiZANidine 4 MG tablet Commonly known as: ZANAFLEX Take 4 mg by mouth at bedtime.   zolpidem 10 MG tablet Commonly known as: AMBIEN Take 10 mg by mouth as needed for sleep.        Allergies:  Allergies  Allergen Reactions   Amoxicillin     Other reaction(s): rash?   Prednisone Other (See Comments)    Very bad mood swings   Tadalafil     Other reaction(s): ha   Tramadol Hcl     Other reaction(s): difficulty urinating    Family History: Family History  Problem Relation Age of Onset   Hypertension Mother    Cerebral aneurysm Father    Aortic aneurysm Father    Aortic aneurysm Paternal Grandfather    Aortic aneurysm Paternal Uncle    Prostate cancer Neg Hx    Kidney cancer Neg Hx     Social History:  reports that he has never smoked. He has never used smokeless tobacco. He reports that he does not drink alcohol and does not use drugs.   Physical Exam: Ht 6' (1.829 m)   BMI 38.52 kg/m   Constitutional:  Alert and oriented, No acute distress. HEENT: [place] AT, moist mucus membranes.  Trachea  midline, no masses. Cardiovascular: No clubbing, cyanosis, or edema. Rectal: Normal sphincter tone.  Prostamegaly, nontender without nodularity Skin: No rashes, bruises or suspicious lesions. Neurologic: Grossly intact,  no focal deficits, moving all 4 extremities. Psychiatric: Normal mood and affect.   Assessment & Plan:    1. BPH without obstruction/lower urinary tract symptoms Doing well, urinary symptoms minimal and stable  PSA screening updated, PSA remains within his normal range  We will continue to follow annually with rectal exam and PSA - Urinalysis, Complete - Bladder Scan (Post Void Residual) in office - PSA; Future  2.  History of hematuria No evidence of blood in the urine today previous negative work-ups - Urinalysis, Complete - Bladder Scan (Post Void Residual) in office  3. Combined arterial insufficiency and corporo-venous occlusive erectile dysfunction Interested in trying tadalafil, dose reduce if headaches.  We discussed optimal ministration.  We discussed alternatives today including ICI, VED and IPP.  He is not interested in any of these.   Return in about 1 year (around 06/22/2023) for IPSS/ PVR/ UA/ PSA/ DRE.  Hollice Espy, MD  [facility] [address] [place], [place] [address] [phone]

## 2022-07-26 ENCOUNTER — Ambulatory Visit
Admission: RE | Admit: 2022-07-26 | Discharge: 2022-07-26 | Disposition: A | Discharge status: Home / Self Care | Payer: PPO | Source: Ambulatory Visit | Attending: Family Medicine | Admitting: Family Medicine

## 2022-07-26 ENCOUNTER — Other Ambulatory Visit: Payer: Self-pay | Admitting: Family Medicine

## 2022-07-26 DIAGNOSIS — R053 Chronic cough: Secondary | ICD-10-CM

## 2022-07-27 DIAGNOSIS — R053 Chronic cough: Secondary | ICD-10-CM | POA: Diagnosis not present

## 2022-07-27 DIAGNOSIS — U099 Post covid-19 condition, unspecified: Secondary | ICD-10-CM | POA: Diagnosis not present

## 2022-07-28 NOTE — Progress Notes (Addendum)
Office Visit    Patient Name: Eric Mckenzie Date of Encounter: 07/29/2022  PCP:  Harlan Stains, MD   [facility]  Cardiologist:  Fransico Him, MD  Advanced Practice Provider:  No care team member to display Electrophysiologist:  None   HPI    Eric Mckenzie is a 71 y.o. male with a past medical history significant for prediabetes, GERD, CVA, dyslipidemia, and palpitations presents today for follow-up visit.  He was last seen in the office 03/04/2022.  He recently was seen by his PCP having episodic palpitations for 20 to 30 minutes at a time about 2 times a month which has been going on for the past year.  Over the last 3 months they have occurred more frequently on a daily basis lasting about 30 to 60 minutes.  He stated that they did start suddenly and he felt like his heart was racing.  It was then abruptly stopped.  PCP was concerned because he has CRF's for CAD.  He is also noticed some DOE attributed to his weight.  He denied any chest pressure/pain.  He has never smoked.  He has a strong family history of aortic aneurysms.   He was last seen by me, 04/28/2022 and  he stated that he was taken off metoprolol and started on Cardizem for his palpitations.  He has had lower extremity edema with both medications.  We will try carvedilol today at the lowest dose in hopes to suppress his palpitations but also not create lower extremity edema.  He is off of his Cardizem at the moment.  His blood pressure slightly elevated in the clinic today however, at home he states is usually 134/78.  We provided him with a blood pressure log for him to keep track.  We have ordered a BMP to check his kidney function since he is experiencing more lower extremity edema.  I explained to the patient that usually these medications are not particularly nephrotoxic but we will check this to be sure.  Otherwise, he is doing well.  Today, he shares that he felt his palpitations were  stress-induced.  His father has some dementia and is recently placed in a memory unit which is a locked unit.  This has caused him a lot of peace of mind.  Overall, he feels like his palpitations have improved with the change of beta-blocker to carvedilol.  He had one earlier this week but other than that has been palpitation free.  He drinks 2 cups of coffee a day and 1 Coke a day.  We discussed trying to limit caffeine intake.  Encouraged hydration.  He is still having some DOE and we reviewed his most recent echocardiogram 03/2022 with normal LVEF.  He states that his symptoms are worse in the summer and better in the fall and plans to see a pulmonologist for further work-up.  Reports no shortness of breath nor dyspnea on exertion. Reports no chest pain, pressure, or tightness. No edema, orthopnea, PND. Reports no palpitations.    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   Lacunar infarct, acute ([name])    Left sided sciatica    Morbid obesity due to excess calories (HCC)    Nonalcoholic steatohepatitis    Palpitations    Prediabetes    RLS (restless legs syndrome)    SBO (small bowel obstruction) ([place]) 04/21/2017   Situational stress    Urinary retention 11/17/2015   Ventral hernia without obstruction or gangrene 05/11/2017   Added automatically from request for surgery [id number]   Past Surgical History:  Procedure Laterality Date   HERNIA REPAIR  2018   TRANSURETHRAL RESECTION OF PROSTATE  2017    Allergies  Allergies  Allergen Reactions    Amoxicillin     Other reaction(s): rash?   Prednisone Other (See Comments)    Very bad mood swings   Tadalafil     Other reaction(s): ha   Tramadol Hcl     Other reaction(s): difficulty urinating     EKGs/Labs/Other Studies Reviewed:   The following studies were reviewed today:  Echocardiogram 03/25/2022  IMPRESSIONS     1. Left ventricular ejection fraction, by estimation, is 60 to 65%. The  left ventricle has normal function. The left ventricle has no regional  wall motion abnormalities. Left ventricular diastolic parameters were  normal.   2. Right ventricular systolic function is normal. The right ventricular  size is mildly enlarged. Tricuspid regurgitation signal is inadequate for  assessing PA pressure.   3. Right atrial size was mildly dilated.   4. The mitral valve is grossly normal. No evidence of mitral valve  regurgitation. No evidence of mitral stenosis.   5. The aortic valve is tricuspid. Aortic valve regurgitation is not  visualized. No aortic stenosis is present.   6. Aortic dilatation noted. There is mild dilatation of the ascending  aorta, measuring 40 mm.   Comparison(s): No prior Echocardiogram. CT 03/08/22 Ascending aorta 39 mm.   Monitor 03/29/2022  Predominant rhythm was normal sinus rhythm with an average heart rate of 80 bpm and ranged from 47 to 148 bpm. There were 6 episodes of SVT with the longest episode lasting 19 beats and the fastest episode occurring at 182 bpm. It appears to be paroxysmal atrial tachycardia. Occasional PACs Occasional PVCs, ventricular couplets, ventricular salvos with PVC load less than 1%  EKG:  EKG is ordered today.  Shows 81 bpm, normal sinus rhythm.  Recent Labs: 04/28/2022: BUN 11; Creatinine, Ser 0.92; Potassium 4.0; Sodium 139  Recent Lipid Panel No results found for: "CHOL", "TRIG", "HDL", "CHOLHDL", "VLDL", "LDLCALC", "LDLDIRECT"  Home Medications   Current Meds  Medication Sig   aspirin 325 MG buffered  tablet Take 325 mg by mouth daily.   budesonide-formoterol (SYMBICORT) 160-4.5 MCG/ACT inhaler Inhale 2 puffs into the lungs as needed for shortness of breath.   carvedilol (COREG) 3.125 MG tablet Take 1 tablet (3.125 mg total) by mouth 2 (two) times daily.   diazepam (VALIUM) 10 MG tablet SMARTSIG:1 Tablet(s) By Mouth   diclofenac (VOLTAREN) 75 MG EC tablet Take 1 tablet (75 mg total) by mouth 2 (two) times daily.   famotidine (PEPCID) 10 MG tablet Take 1 tablet by mouth as needed.   fish oil-omega-3 fatty acids 1000 MG capsule Take 2 g by mouth daily.   lisinopril-hydrochlorothiazide (ZESTORETIC) 20-12.5 MG tablet    loratadine (CLARITIN) 10 MG tablet    Multiple Vitamin (MULTIVITAMIN) capsule Take 1 capsule  by mouth daily.   pravastatin (PRAVACHOL) 40 MG tablet Take 40 mg by mouth daily.   rOPINIRole (REQUIP) 1 MG tablet Take 1 mg by mouth 2 (two) times daily.   tadalafil (CIALIS) 20 MG tablet Take 1 tablet (20 mg total) by mouth daily as needed for erectile dysfunction.   tiZANidine (ZANAFLEX) 4 MG tablet Take 4 mg by mouth at bedtime.   zolpidem (AMBIEN) 10 MG tablet Take 10 mg by mouth as needed for sleep.      Review of Systems      All other systems reviewed and are otherwise negative except as noted above.  Physical Exam    VS:  BP (!) 148/92   Pulse 81   Ht 6' (1.829 m)   Wt 283 lb 6.4 oz (128.5 kg)   SpO2 95%   BMI 38.44 kg/m  , BMI Body mass index is 38.44 kg/m.  Wt Readings from Last 3 Encounters:  07/29/22 283 lb 6.4 oz (128.5 kg)  04/28/22 284 lb (128.8 kg)  03/04/22 281 lb 3.2 oz (127.6 kg)     GEN: Well nourished, well developed, in no acute distress. HEENT: normal. Neck: Supple, no JVD, carotid bruits, or masses. Cardiac: RRR, no murmurs, rubs, or gallops. No clubbing, cyanosis, edema.  Radials/PT 2+ and equal bilaterally.  Respiratory:  Respirations regular and unlabored, clear to auscultation bilaterally. GI: Soft, nontender, nondistended. MS: No  deformity or atrophy. Skin: Warm and dry, no rash. Neuro:  Strength and sensation are intact. Psych: Normal affect.  Assessment & Plan    Palpitations with recent cardiac monitor -Monitor showed 6 episodes of SVT, nonsustained.  Occasional PACs and occasional PVCs.  Mostly normal sinus rhythm with average heart rate of 80 bpm. -He has not been able to tolerate metoprolol or Cardizem due to lower extremity edema -Tolerating carvedilol 3.125 mg  -continue current medications  Risk factors for CAD -Continue GDMT with aspirin 325 mg, lisinopril/HCTZ 20-12-1/2 mg, pravastatin 40 mg -Recently started on Symbicort and he states this has improved his shortness of breath significantly. -Plans to f/u with pulm  Hypertension -Slightly elevated today  -at home it is usually 127-135/82-89 -continue to keep track of BP -continue low-sodium diet and current medications   Hyperlipidemia -Lipid panel today -continue statin therapy   Family history of aortic aneurysms -Echocardiogram with normal heart function 60 to 65%, mild right ventricular enlargement, mild right atrial enlargement, no valvular disease, the ascending aorta mildly dilated at 40 mm. -Repeat 2D echo 03/2023 -CTA showed ascending thoracic aortic dilatation at 3.9 cm, repeat study in 1 year (03/2023)    Disposition: Follow up 8 months with Fransico Him, MD or APP.  Signed, Elgie Collard, PA-C 07/29/2022, 9:55 AM [facility]

## 2022-07-29 ENCOUNTER — Encounter: Payer: Self-pay | Admitting: Physician Assistant

## 2022-07-29 ENCOUNTER — Ambulatory Visit: Payer: PPO | Attending: Physician Assistant | Admitting: Physician Assistant

## 2022-07-29 VITALS — BP 148/92 | HR 81 | Ht 72.0 in | Wt 283.4 lb

## 2022-07-29 DIAGNOSIS — I10 Essential (primary) hypertension: Secondary | ICD-10-CM | POA: Diagnosis not present

## 2022-07-29 DIAGNOSIS — E785 Hyperlipidemia, unspecified: Secondary | ICD-10-CM | POA: Diagnosis not present

## 2022-07-29 DIAGNOSIS — Z9189 Other specified personal risk factors, not elsewhere classified: Secondary | ICD-10-CM | POA: Diagnosis not present

## 2022-07-29 DIAGNOSIS — R0609 Other forms of dyspnea: Secondary | ICD-10-CM | POA: Diagnosis not present

## 2022-07-29 DIAGNOSIS — I77819 Aortic ectasia, unspecified site: Secondary | ICD-10-CM | POA: Diagnosis not present

## 2022-07-29 DIAGNOSIS — R002 Palpitations: Secondary | ICD-10-CM

## 2022-07-29 DIAGNOSIS — I7121 Aneurysm of the ascending aorta, without rupture: Secondary | ICD-10-CM

## 2022-07-29 LAB — LIPID PANEL
Chol/HDL Ratio: 4.1 ratio (ref 0.0–5.0)
Cholesterol, Total: 179 mg/dL (ref 100–199)
HDL: 44 mg/dL (ref >39)
LDL Chol Calc (NIH): 109 mg/dL — ABNORMAL HIGH (ref 0–99)
Triglycerides: 144 mg/dL (ref 0–149)
VLDL Cholesterol Cal: 26 mg/dL (ref 5–40)

## 2022-07-29 NOTE — Patient Instructions (Signed)
Medication Instructions:   Your physician recommends that you continue on your current medications as directed. Please refer to the Current Medication list given to you today.  *If you need a refill on your cardiac medications before your next appointment, please call your pharmacy*   Lab Work: LIPID TODAY    If you have labs (blood work) drawn today and your tests are completely normal, you will receive your results only by: [facility] (if you have MyChart) OR A paper copy in the mail If you have any lab test that is abnormal or we need to change your treatment, we will call you to review the results.   Testing/Procedures:  BEFORE JUNE 2024.Your physician has requested that you have an echocardiogram. Echocardiography is a painless test that uses sound waves to create images of your heart. It provides your doctor with information about the size and shape of your heart and how well your heart's chambers and valves are working. This procedure takes approximately one hour. There are no restrictions for this procedure. Please do NOT wear cologne, perfume, aftershave, or lotions (deodorant is allowed). Please arrive 15 minutes prior to your appointment time.  BEFORE JUNE 2024 Non-Cardiac CT Angiography (CTA), is a special type of CT scan that uses a computer to produce multi-dimensional views of major blood vessels throughout the body. In CT angiography, a contrast material is injected through an IV to help visualize the blood vessels   Follow-Up: At [facility], you and your health needs are our priority.  As part of our continuing mission to provide you with exceptional heart care, we have created designated Provider Care Teams.  These Care Teams include your primary Cardiologist (physician) and Advanced Practice Providers (APPs -  Physician Assistants and Nurse Practitioners) who all work together to provide you with the care you need, when you need it.  We recommend signing  up for the patient portal called "MyChart".  Sign up information is provided on this After Visit Summary.  MyChart is used to connect with patients for Virtual Visits (Telemedicine).  Patients are able to view lab/test results, encounter notes, upcoming appointments, etc.  Non-urgent messages can be sent to your provider as well.   To learn more about what you can do with MyChart, go to [email or web address].    Your next appointment:   8 month(s)  The format for your next appointment:   In Person  Provider:   Nicholes Rough, PA-C or  Fransico Him, MD will plan to see you again in 8 month(s).   Other Instructions   Important Information About Sugar

## 2022-09-08 DIAGNOSIS — Z23 Encounter for immunization: Secondary | ICD-10-CM | POA: Diagnosis not present

## 2022-09-08 DIAGNOSIS — Z0000 Encounter for general adult medical examination without abnormal findings: Secondary | ICD-10-CM | POA: Diagnosis not present

## 2022-09-08 DIAGNOSIS — R7303 Prediabetes: Secondary | ICD-10-CM | POA: Diagnosis not present

## 2022-09-08 DIAGNOSIS — K7581 Nonalcoholic steatohepatitis (NASH): Secondary | ICD-10-CM | POA: Diagnosis not present

## 2022-09-08 DIAGNOSIS — G4700 Insomnia, unspecified: Secondary | ICD-10-CM | POA: Diagnosis not present

## 2022-09-08 DIAGNOSIS — Z862 Personal history of diseases of the blood and blood-forming organs and certain disorders involving the immune mechanism: Secondary | ICD-10-CM | POA: Diagnosis not present

## 2022-09-08 DIAGNOSIS — I700 Atherosclerosis of aorta: Secondary | ICD-10-CM | POA: Diagnosis not present

## 2022-09-08 DIAGNOSIS — E785 Hyperlipidemia, unspecified: Secondary | ICD-10-CM | POA: Diagnosis not present

## 2022-09-08 DIAGNOSIS — I10 Essential (primary) hypertension: Secondary | ICD-10-CM | POA: Diagnosis not present

## 2022-09-08 DIAGNOSIS — J301 Allergic rhinitis due to pollen: Secondary | ICD-10-CM | POA: Diagnosis not present

## 2022-09-08 DIAGNOSIS — G2581 Restless legs syndrome: Secondary | ICD-10-CM | POA: Diagnosis not present

## 2022-11-02 ENCOUNTER — Encounter: Payer: Self-pay | Admitting: Pulmonary Disease

## 2022-11-02 ENCOUNTER — Ambulatory Visit (INDEPENDENT_AMBULATORY_CARE_PROVIDER_SITE_OTHER): Payer: PPO | Admitting: Pulmonary Disease

## 2022-11-02 VITALS — BP 120/80 | HR 83 | Ht 72.0 in | Wt 284.8 lb

## 2022-11-02 DIAGNOSIS — Z8616 Personal history of COVID-19: Secondary | ICD-10-CM

## 2022-11-02 DIAGNOSIS — R0609 Other forms of dyspnea: Secondary | ICD-10-CM

## 2022-11-02 NOTE — Progress Notes (Signed)
Synopsis: Referred in Jan 2024 for lung nodule by Harlan Stains, MD  Subjective:   PATIENT ID: Eric Mckenzie GENDER: male DOB: 1951/07/11, MRN: [id number]  Chief Complaint  Patient presents with   Consult    SOB, cough    This is a 72 year old gentleman, past medical history of hypertension, insomnia, morbid obesity.  Patient is a never smoker.  He had COVID a year ago and feels like he has not been the same since.  He had increased shortness of breath dyspnea on exertion.  He has a cough.  Usually dry nonproductive.  He is morbidly obese with a BMI of 38.  He states that has been trying to go to the gym and workout with his wife be more conscientious of his weight.    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   Lacunar infarct, acute (HCC)    Left sided sciatica    Morbid obesity due to excess calories (HCC)    Nonalcoholic steatohepatitis    Palpitations    Prediabetes    RLS (restless legs syndrome)    SBO (small bowel obstruction) ([place]) 04/21/2017   Situational stress    Urinary retention 11/17/2015   Ventral hernia without obstruction or gangrene 05/11/2017   Added automatically from request for surgery [id number]     Family History  Problem Relation Age of Onset   Hypertension Mother    Cerebral aneurysm Father    Aortic aneurysm Father    Aortic aneurysm Paternal Grandfather    Aortic aneurysm Paternal Uncle    Prostate cancer Neg Hx    Kidney cancer Neg Hx      Past Surgical History:  Procedure Laterality Date   HERNIA REPAIR  2018    TRANSURETHRAL RESECTION OF PROSTATE  2017    Social History   Socioeconomic History   Marital status: Married    Spouse name: Not on file   Number of children: Not on file   Years of education: Not on file   Highest education level: Not on file  Occupational History   Not on file  Tobacco Use   Smoking status: Never   Smokeless tobacco: Never   Tobacco comments:    never used product  Substance and Sexual Activity   Alcohol use: No    Alcohol/week: 0.0 standard drinks of alcohol   Drug use: No   Sexual activity: Not on file  Other Topics Concern   Not on file  Social History Narrative   Not on file   Social Determinants of Health   Financial Resource Strain: Not on file  Food Insecurity: Not on file  Transportation Needs: Not on file  Physical Activity: Not on file  Stress: Not on file  Social Connections: Not on file  Intimate Partner Violence: Not on file     Allergies  Allergen Reactions   Amoxicillin     Other reaction(s): rash?   Prednisone Other (See Comments)    Very bad mood swings   Tadalafil  Other reaction(s): ha   Tramadol Hcl     Other reaction(s): difficulty urinating     Outpatient Medications Prior to Visit  Medication Sig Dispense Refill   budesonide-formoterol (SYMBICORT) 160-4.5 MCG/ACT inhaler Inhale 2 puffs into the lungs as needed for shortness of breath.     aspirin 325 MG buffered tablet Take 325 mg by mouth daily.     carvedilol (COREG) 3.125 MG tablet Take 1 tablet (3.125 mg total) by mouth 2 (two) times daily. 180 tablet 1   diazepam (VALIUM) 10 MG tablet SMARTSIG:1 Tablet(s) By Mouth     diclofenac (VOLTAREN) 75 MG EC tablet Take 1 tablet (75 mg total) by mouth 2 (two) times daily. 60 tablet 1   famotidine (PEPCID) 10 MG tablet Take 1 tablet by mouth as needed.     fish oil-omega-3 fatty acids 1000 MG capsule Take 2 g by mouth daily.     lisinopril-hydrochlorothiazide (ZESTORETIC) 20-12.5 MG tablet      loratadine (CLARITIN)  10 MG tablet  (Patient not taking: Reported on 11/02/2022)     Multiple Vitamin (MULTIVITAMIN) capsule Take 1 capsule by mouth daily.     pravastatin (PRAVACHOL) 40 MG tablet Take 40 mg by mouth daily.     rOPINIRole (REQUIP) 1 MG tablet Take 1 mg by mouth 2 (two) times daily.     tadalafil (CIALIS) 20 MG tablet Take 1 tablet (20 mg total) by mouth daily as needed for erectile dysfunction. 6 tablet 11   tiZANidine (ZANAFLEX) 4 MG tablet Take 4 mg by mouth at bedtime.     zolpidem (AMBIEN) 10 MG tablet Take 10 mg by mouth as needed for sleep.      No facility-administered medications prior to visit.    Review of Systems  Constitutional:  Negative for chills, fever, malaise/fatigue and weight loss.  HENT:  Negative for hearing loss, sore throat and tinnitus.   Eyes:  Negative for blurred vision and double vision.  Respiratory:  Negative for cough, hemoptysis, sputum production, shortness of breath, wheezing and stridor.   Cardiovascular:  Negative for chest pain, palpitations, orthopnea, leg swelling and PND.  Gastrointestinal:  Negative for abdominal pain, constipation, diarrhea, heartburn, nausea and vomiting.  Genitourinary:  Negative for dysuria, hematuria and urgency.  Musculoskeletal:  Negative for joint pain and myalgias.  Skin:  Negative for itching and rash.  Neurological:  Negative for dizziness, tingling, weakness and headaches.  Endo/Heme/Allergies:  Negative for environmental allergies. Does not bruise/bleed easily.  Psychiatric/Behavioral:  Negative for depression. The patient is not nervous/anxious and does not have insomnia.   All other systems reviewed and are negative.    Objective:  Physical Exam Vitals reviewed.  Constitutional:      General: He is not in acute distress.    Appearance: He is well-developed.  HENT:     Head: Normocephalic and atraumatic.  Eyes:     General: No scleral icterus.    Conjunctiva/sclera: Conjunctivae normal.     Pupils: Pupils are  equal, round, and reactive to light.  Neck:     Vascular: No JVD.     Trachea: No tracheal deviation.  Cardiovascular:     Rate and Rhythm: Normal rate and regular rhythm.     Heart sounds: Normal heart sounds. No murmur heard. Pulmonary:     Effort: Pulmonary effort is normal. No tachypnea, accessory muscle usage or respiratory distress.     Breath sounds: No stridor. No wheezing, rhonchi or rales.  Abdominal:  General: There is no distension.     Palpations: Abdomen is soft.     Tenderness: There is no abdominal tenderness.  Musculoskeletal:        General: No tenderness.     Cervical back: Neck supple.  Lymphadenopathy:     Cervical: No cervical adenopathy.  Skin:    General: Skin is warm and dry.     Capillary Refill: Capillary refill takes less than 2 seconds.     Findings: No rash.  Neurological:     Mental Status: He is alert and oriented to person, place, and time.  Psychiatric:        Behavior: Behavior normal.      Vitals:   11/02/22 1302  BP: 120/80  Pulse: 83  SpO2: 94%  Weight: 284 lb 12.8 oz (129.2 kg)  Height: 6' (1.829 m)   94% on RA BMI Readings from Last 3 Encounters:  11/02/22 38.63 kg/m  07/29/22 38.44 kg/m  06/21/22 38.52 kg/m   Wt Readings from Last 3 Encounters:  11/02/22 284 lb 12.8 oz (129.2 kg)  07/29/22 283 lb 6.4 oz (128.5 kg)  04/28/22 284 lb (128.8 kg)     CBC    Component Value Date/Time   WBC 7.7 11/20/2015 1051   WBC 5.8 07/09/2009 0810   WBC 4.9 05/06/2009 0744   RBC 5.21 11/20/2015 1051   RBC 4.88 07/09/2009 0810   RBC 4.99 05/06/2009 0744   HGB 15.8 11/20/2015 1051   HGB 16.1 07/09/2009 0810   HCT 44.3 11/20/2015 1051   HCT 45.5 07/09/2009 0810   PLT 125 Platelet count consistent in citrate (L) 11/20/2015 1051   PLT 82 (L) 07/09/2009 0810   MCV 85 11/20/2015 1051   MCV 93.1 07/09/2009 0810   MCH 30.3 11/20/2015 1051   MCHC 35.7 11/20/2015 1051   MCHC 35.3 07/09/2009 0810   MCHC 35.3 05/06/2009 0744    RDW 13.3 11/20/2015 1051   RDW 13.4 07/09/2009 0810   LYMPHSABS 2.2 11/20/2015 1051   LYMPHSABS 2.3 07/09/2009 0810   MONOABS 0.4 07/09/2009 0810   EOSABS 0.1 11/20/2015 1051   BASOSABS 0.0 11/20/2015 1051   BASOSABS 0.1 07/09/2009 0810    Chest Imaging:  CTA chest 03/08/2022: Lungs at that time appeared clear no evidence of interstitial disease. Ascending aorta 3.9 cm. The patient's images have been independently reviewed by me.    Pulmonary Functions Testing Results:     No data to display          FeNO:   Pathology:   Echocardiogram:  Heart Catheterization:     Assessment & Plan:     ICD-10-CM   1. DOE (dyspnea on exertion)  R06.09     2. History of COVID-19  Z86.16       Discussion:  This is a 72 year old gentleman past medical history of obesity.  Recurrent shortness of breath dyspnea on exertion bronchitis symptoms.  Has been slowly getting worse over the past year after having COVID-19 approximately a year ago.  Plan: I am not sure exactly what is driving his symptoms. Wonder if he has developed asthma symptoms post COVID. He has been using Symbicort and I think he can stay on this for now. Pulmonary function test to be complete prior to next office visit. Orders also placed for HRCT imaging of the chest.  RTC in a few weeks after both tests have been complete.   Current Outpatient Medications:    budesonide-formoterol (SYMBICORT) 160-4.5 MCG/ACT inhaler, Inhale 2  puffs into the lungs as needed for shortness of breath., Disp: , Rfl:    aspirin 325 MG buffered tablet, Take 325 mg by mouth daily., Disp: , Rfl:    carvedilol (COREG) 3.125 MG tablet, Take 1 tablet (3.125 mg total) by mouth 2 (two) times daily., Disp: 180 tablet, Rfl: 1   diazepam (VALIUM) 10 MG tablet, SMARTSIG:1 Tablet(s) By Mouth, Disp: , Rfl:    diclofenac (VOLTAREN) 75 MG EC tablet, Take 1 tablet (75 mg total) by mouth 2 (two) times daily., Disp: 60 tablet, Rfl: 1   famotidine  (PEPCID) 10 MG tablet, Take 1 tablet by mouth as needed., Disp: , Rfl:    fish oil-omega-3 fatty acids 1000 MG capsule, Take 2 g by mouth daily., Disp: , Rfl:    lisinopril-hydrochlorothiazide (ZESTORETIC) 20-12.5 MG tablet, , Disp: , Rfl:    loratadine (CLARITIN) 10 MG tablet, , Disp: , Rfl:    Multiple Vitamin (MULTIVITAMIN) capsule, Take 1 capsule by mouth daily., Disp: , Rfl:    pravastatin (PRAVACHOL) 40 MG tablet, Take 40 mg by mouth daily., Disp: , Rfl:    rOPINIRole (REQUIP) 1 MG tablet, Take 1 mg by mouth 2 (two) times daily., Disp: , Rfl:    tadalafil (CIALIS) 20 MG tablet, Take 1 tablet (20 mg total) by mouth daily as needed for erectile dysfunction., Disp: 6 tablet, Rfl: 11   tiZANidine (ZANAFLEX) 4 MG tablet, Take 4 mg by mouth at bedtime., Disp: , Rfl:    zolpidem (AMBIEN) 10 MG tablet, Take 10 mg by mouth as needed for sleep. , Disp: , Rfl:    Garner Nash, DO [facility] 11/02/2022 1:16 PM

## 2022-11-02 NOTE — Patient Instructions (Signed)
Thank you for visiting Dr. Valeta Harms at [facility]. Today we recommend the following:  Orders Placed This Encounter  Procedures   CT CHEST HIGH RESOLUTION   Pulmonary Function Test   Return in about 6 weeks (around 12/14/2022) for with APP or Dr. Valeta Harms.    Please do your part to reduce the spread of COVID-19.

## 2022-11-23 DIAGNOSIS — Z1211 Encounter for screening for malignant neoplasm of colon: Secondary | ICD-10-CM | POA: Diagnosis not present

## 2022-12-06 DIAGNOSIS — H5213 Myopia, bilateral: Secondary | ICD-10-CM | POA: Diagnosis not present

## 2022-12-06 DIAGNOSIS — H25813 Combined forms of age-related cataract, bilateral: Secondary | ICD-10-CM | POA: Diagnosis not present

## 2022-12-09 ENCOUNTER — Ambulatory Visit (HOSPITAL_BASED_OUTPATIENT_CLINIC_OR_DEPARTMENT_OTHER)
Admission: RE | Admit: 2022-12-09 | Discharge: 2022-12-09 | Disposition: A | Discharge status: Home / Self Care | Payer: PPO | Source: Ambulatory Visit | Attending: Family Medicine | Admitting: Family Medicine

## 2022-12-09 DIAGNOSIS — R0609 Other forms of dyspnea: Secondary | ICD-10-CM | POA: Insufficient documentation

## 2022-12-09 DIAGNOSIS — R0600 Dyspnea, unspecified: Secondary | ICD-10-CM | POA: Diagnosis not present

## 2022-12-14 ENCOUNTER — Ambulatory Visit (INDEPENDENT_AMBULATORY_CARE_PROVIDER_SITE_OTHER): Payer: PPO | Admitting: Pulmonary Disease

## 2022-12-14 ENCOUNTER — Encounter: Payer: Self-pay | Admitting: Pulmonary Disease

## 2022-12-14 VITALS — BP 132/66 | HR 73 | Ht 71.0 in | Wt 284.8 lb

## 2022-12-14 DIAGNOSIS — R0609 Other forms of dyspnea: Secondary | ICD-10-CM | POA: Diagnosis not present

## 2022-12-14 LAB — PULMONARY FUNCTION TEST
DL/VA % pred: 115 %
DL/VA: 4.65 ml/min/mmHg/L
DLCO cor % pred: 102 %
DLCO cor: 27.51 ml/min/mmHg
DLCO unc % pred: 102 %
DLCO unc: 27.51 ml/min/mmHg
FEF 25-75 Post: 1.63 L/sec
FEF 25-75 Pre: 2.1 L/sec
FEF2575-%Change-Post: -22 %
FEF2575-%Pred-Post: 63 %
FEF2575-%Pred-Pre: 81 %
FEV1-%Change-Post: -12 %
FEV1-%Pred-Post: 67 %
FEV1-%Pred-Pre: 76 %
FEV1-Post: 2.29 L
FEV1-Pre: 2.6 L
FEV1FVC-%Change-Post: -17 %
FEV1FVC-%Pred-Pre: 96 %
FEV6-%Change-Post: 7 %
FEV6-%Pred-Post: 89 %
FEV6-%Pred-Pre: 83 %
FEV6-Post: 3.87 L
FEV6-Pre: 3.61 L
FEV6FVC-%Change-Post: 0 %
FEV6FVC-%Pred-Post: 105 %
FEV6FVC-%Pred-Pre: 104 %
FVC-%Change-Post: 6 %
FVC-%Pred-Post: 84 %
FVC-%Pred-Pre: 79 %
FVC-Post: 3.9 L
FVC-Pre: 3.67 L
Post FEV1/FVC ratio: 59 %
Post FEV6/FVC ratio: 99 %
Pre FEV1/FVC ratio: 71 %
Pre FEV6/FVC Ratio: 99 %
RV % pred: 119 %
RV: 2.99 L
TLC % pred: 94 %
TLC: 6.86 L

## 2022-12-14 NOTE — Patient Instructions (Signed)
Full PFT performed today. °

## 2022-12-14 NOTE — Progress Notes (Signed)
Full PFT performed today. °

## 2022-12-14 NOTE — Patient Instructions (Signed)
Nice to meet you  The CT scan of your chest looks normal, this is great news  Pulmonary function tests are normal with the exception of a little bit of air trapping  Air trapping can be a subtle sign of asthma, this may have developed since her COVID infection  Symbicort does not seem to help, you can stop Symbicort.  Use the samples of Stiolto, 2 puffs once a day.  Sample should last for 4 weeks.  Please send [place] a message in the next 2 to 3 weeks if it is helping so we can prescribe it and arrange ongoing follow-up.  Return to clinic as needed

## 2022-12-14 NOTE — Progress Notes (Signed)
$'@Patient'T$  ID: Eric Mckenzie, male    DOB: 16-May-1951, 72 y.o.   MRN: [id number]  Chief Complaint  Patient presents with   Follow-up    Pt is here for follow up for DOE> pt states that he was on symbicort. Pt states that the inhaler did help some. Pt had full pfts done today     Referring provider: Harlan Stains, MD  HPI:   72 y.o. man whom we are seeing in follow-up for dyspnea on exertion.  Pulmonary note from Dr. Valeta Harms reviewed.  Most recent cardiology note reviewed.  Patient reports onset of dyspnea guarding and or after contracting COVID back in 2022.  Another bout in 2023, early 2023.  Prior to this is walking 3 miles at a time.  Ever since then very short of breath.  Not walking much at all.  Decreased activity overall.  This prompted pulmonary evaluation.  Initial evaluation concern for small airways disease after COVID.  Placed on Symbicort.  CT high-res and PFTs ordered.  Does not think Symbicort helps much.  Cannot tell much of a difference.  Reviewed results of CT in detail.  CT shows no signs of LD, clear lungs, no nodules etc.  Patient was very concerned about nodule that he had some in the past.  I do not see any evidence of this on prior CT scans report or on my review of images,but regardless most recent CT scan 12/2022 without any nodules.  Reviewed pulmonary function test that are essentially normal, full interpretation below, with exception of air trapping on lung volumes.   Questionaires / Pulmonary Flowsheets:   ACT:      No data to display          MMRC:     No data to display          Epworth:      No data to display          Tests:   FENO:  No results found for: "NITRICOXIDE"  PFT:    Latest Ref Rng & Units 12/14/2022   11:45 AM  PFT Results  FVC-Pre L 3.67  P  FVC-Predicted Pre % 79  P  FVC-Post L 3.90  P  FVC-Predicted Post % 84  P  Pre FEV1/FVC % % 71  P  Post FEV1/FCV % % 59  P  FEV1-Pre L 2.60  P  FEV1-Predicted Pre % 76   P  FEV1-Post L 2.29  P  DLCO uncorrected ml/min/mmHg 27.51  P  DLCO UNC% % 102  P  DLCO corrected ml/min/mmHg 27.51  P  DLCO COR %Predicted % 102  P  DLVA Predicted % 115  P  TLC L 6.86  P  TLC % Predicted % 94  P  RV % Predicted % 119  P    P Preliminary result  Personally reviewed and interpreted as spirometry is normal, no bronchodilator response, lung volumes consistent with air trapping otherwise normal, DLCO within normal limits.  WALK:      No data to display          Imaging: Personally reviewed and as per EMR discussion in this note CT CHEST HIGH RESOLUTION  Result Date: 12/11/2022 CLINICAL DATA:  72 year old male with history of dyspnea on exertion. EXAM: CT CHEST WITHOUT CONTRAST TECHNIQUE: Multidetector CT imaging of the chest was performed following the standard protocol without intravenous contrast. High resolution imaging of the lungs, as well as inspiratory and expiratory imaging, was performed. RADIATION  DOSE REDUCTION: This exam was performed according to the departmental dose-optimization program which includes automated exposure control, adjustment of the mA and/or kV according to patient size and/or use of iterative reconstruction technique. COMPARISON:  Chest CTA 03/08/2022. FINDINGS: Cardiovascular: Heart size is normal. There is no significant pericardial fluid, thickening or pericardial calcification. There is aortic atherosclerosis, as well as atherosclerosis of the great vessels of the mediastinum and the coronary arteries, including calcified atherosclerotic plaque in the left anterior descending coronary artery. Mediastinum/Nodes: No pathologically enlarged mediastinal or hilar lymph nodes. Please note that accurate exclusion of hilar adenopathy is limited on noncontrast CT scans. Esophagus is unremarkable in appearance. No axillary lymphadenopathy. Lungs/Pleura: High-resolution images demonstrate no significant regions of ground-glass attenuation, septal  thickening, subpleural reticulation, parenchymal banding, traction bronchiectasis or frank honeycombing to indicate interstitial lung disease. Inspiratory and expiratory imaging is unremarkable. No acute consolidative airspace disease. No pleural effusions. No suspicious appearing pulmonary nodules or masses are noted. Upper Abdomen: Aortic atherosclerosis.  Hepatic steatosis. Musculoskeletal: There are no aggressive appearing lytic or blastic lesions noted in the visualized portions of the skeleton. IMPRESSION: 1. No findings to suggest interstitial lung disease. 2. No acute findings in the thorax to account for the patient's symptoms. 3. Aortic atherosclerosis, in addition to left anterior descending coronary artery disease. Please note that although the presence of coronary artery calcium documents the presence of coronary artery disease, the severity of this disease and any potential stenosis cannot be assessed on this non-gated CT examination. Assessment for potential risk factor modification, dietary therapy or pharmacologic therapy may be warranted, if clinically indicated. Electronically Signed   By: Vinnie Langton M.D.   On: 12/11/2022 11:22    Lab Results: Personally reviewed CBC    Component Value Date/Time   WBC 7.7 11/20/2015 1051   WBC 5.8 07/09/2009 0810   WBC 4.9 05/06/2009 0744   RBC 5.21 11/20/2015 1051   RBC 4.88 07/09/2009 0810   RBC 4.99 05/06/2009 0744   HGB 15.8 11/20/2015 1051   HGB 16.1 07/09/2009 0810   HCT 44.3 11/20/2015 1051   HCT 45.5 07/09/2009 0810   PLT 125 Platelet count consistent in citrate (L) 11/20/2015 1051   PLT 82 (L) 07/09/2009 0810   MCV 85 11/20/2015 1051   MCV 93.1 07/09/2009 0810   MCH 30.3 11/20/2015 1051   MCHC 35.7 11/20/2015 1051   MCHC 35.3 07/09/2009 0810   MCHC 35.3 05/06/2009 0744   RDW 13.3 11/20/2015 1051   RDW 13.4 07/09/2009 0810   LYMPHSABS 2.2 11/20/2015 1051   LYMPHSABS 2.3 07/09/2009 0810   MONOABS 0.4 07/09/2009 0810    EOSABS 0.1 11/20/2015 1051   BASOSABS 0.0 11/20/2015 1051   BASOSABS 0.1 07/09/2009 0810    BMET    Component Value Date/Time   NA 139 04/28/2022 0851   K 4.0 04/28/2022 0851   CL 101 04/28/2022 0851   CO2 22 04/28/2022 0851   GLUCOSE 112 (H) 04/28/2022 0851   GLUCOSE 130 (H) 07/13/2009 1037   BUN 11 04/28/2022 0851   CREATININE 0.92 04/28/2022 0851   CALCIUM 9.1 04/28/2022 0851    BNP No results found for: "BNP"  ProBNP No results found for: "PROBNP"  Specialty Problems   None   Allergies  Allergen Reactions   Amoxicillin     Other reaction(s): rash?   Prednisone Other (See Comments)    Very bad mood swings   Tadalafil     Other reaction(s): ha   Tramadol Hcl  Other reaction(s): difficulty urinating    Immunization History  Administered Date(s) Administered   Influenza Inj Mdck Quad Pf 08/08/2017   Influenza,inj,quad, With Preservative 08/08/2017   Moderna Sars-Covid-2 Vaccination 11/20/2019, 12/23/2019    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   Lacunar infarct, acute ([place])    Left sided sciatica    Morbid obesity due to excess calories (HCC)    Nonalcoholic steatohepatitis    Palpitations    Prediabetes    RLS (restless legs syndrome)    SBO (small bowel obstruction) ([place]) 04/21/2017   Situational stress    Urinary retention 11/17/2015   Ventral hernia without obstruction or gangrene 05/11/2017   Added automatically from request for surgery [id number]    Tobacco History: Social History   Tobacco Use  Smoking Status Never   Smokeless Tobacco Never  Tobacco Comments   never used product   Counseling given: Not Answered Tobacco comments: never used product   Continue to not smoke  Outpatient Encounter Medications as of 12/14/2022  Medication Sig   aspirin 325 MG buffered tablet Take 325 mg by mouth daily.   budesonide-formoterol (SYMBICORT) 160-4.5 MCG/ACT inhaler Inhale 2 puffs into the lungs as needed for shortness of breath.   carvedilol (COREG) 3.125 MG tablet Take 1 tablet (3.125 mg total) by mouth 2 (two) times daily.   diazepam (VALIUM) 10 MG tablet SMARTSIG:1 Tablet(s) By Mouth   diclofenac (VOLTAREN) 75 MG EC tablet Take 1 tablet (75 mg total) by mouth 2 (two) times daily.   famotidine (PEPCID) 10 MG tablet Take 1 tablet by mouth as needed.   fish oil-omega-3 fatty acids 1000 MG capsule Take 2 g by mouth daily.   lisinopril-hydrochlorothiazide (ZESTORETIC) 20-12.5 MG tablet    loratadine (CLARITIN) 10 MG tablet    Multiple Vitamin (MULTIVITAMIN) capsule Take 1 capsule by mouth daily.   pravastatin (PRAVACHOL) 40 MG tablet Take 40 mg by mouth daily.   rOPINIRole (REQUIP) 1 MG tablet Take 1 mg by mouth 2 (two) times daily.   tadalafil (CIALIS) 20 MG tablet Take 1 tablet (20 mg total) by mouth daily as needed for erectile dysfunction.   tiZANidine (ZANAFLEX) 4 MG tablet Take 4 mg by mouth at bedtime.   zolpidem (AMBIEN) 10 MG tablet Take 10 mg by mouth as needed for sleep.    No facility-administered encounter medications on file as of 12/14/2022.     Review of Systems  Review of Systems  No chest pain with exertion.  No orthopnea or PND.  Comprehensive review of systems otherwise negative. Physical Exam  BP 132/66 (BP Location: Left Arm, Patient Position: Sitting, Cuff Size: Normal)   Pulse 73   Ht '5\' 11"'$  (1.803 m)   Wt 284 lb 12.8 oz (129.2 kg)   SpO2 95%   BMI 39.72 kg/m   Wt Readings from Last 5 Encounters:  12/14/22 284 lb 12.8 oz (129.2 kg)  11/02/22 284 lb 12.8 oz (129.2 kg)   07/29/22 283 lb 6.4 oz (128.5 kg)  04/28/22 284 lb (128.8 kg)  03/04/22 281 lb 3.2 oz (127.6 kg)    BMI Readings from Last 5 Encounters:  12/14/22 39.72 kg/m  11/02/22 38.63 kg/m  07/29/22 38.44 kg/m  06/21/22 38.52 kg/m  04/28/22 38.52 kg/m     Physical Exam General: Sitting in chair, no acute distress Eyes: EOMI, no icterus Neck: Supple, no JVP Pulmonary: Clear, no work of breathing Cardiovascular: Warm, no edema Abdomen: Nondistended, soft present MSK: No synovitis, no joint effusion Neuro: Normal gait, no weakness Psych: Normal mood, full affect   Assessment & Plan:   Dyspnea on exertion: Suspect related to small airway disease, asthma triggered by COVID infection given no issues with symptoms prior to COVID.  Possibly also related to body habitus although admittedly weight is only up a few pounds over the last 2 years.  PFTs demonstrate air trapping consistent with small airways disease but otherwise normal.  CT high-resolution 12/2022 without sign of ILD, nodule etc.  Very reassuring.  Symbicort not very helpful.  Stop Symbicort, trial of Stiolto via samples.  If helpful he is to contact us so we can prescribe it.  Also, if Stiolto not very helpful, encouraged him to follow-up with his cardiologist for reevaluation of cardiac causes of dyspnea.   Return if symptoms worsen or fail to improve.   Lanier Clam, MD 12/14/2022   This appointment required 40 minutes of patient care (this includes precharting, chart review, review of results, face-to-face care, etc.).

## 2022-12-14 NOTE — Addendum Note (Signed)
Addended byLarey Days on: 12/14/2022 02:09 PM   Modules accepted: Level of Service

## 2022-12-15 NOTE — Progress Notes (Signed)
Thanks matt for seeing him   Thanks,  Adams, DO [facility] 12/15/2022 5:07 PM

## 2023-01-23 ENCOUNTER — Ambulatory Visit (HOSPITAL_COMMUNITY): Payer: PPO | Attending: Internal Medicine

## 2023-01-23 DIAGNOSIS — R0609 Other forms of dyspnea: Secondary | ICD-10-CM | POA: Diagnosis not present

## 2023-01-23 LAB — ECHOCARDIOGRAM COMPLETE
Area-P 1/2: 5.01 cm2
S' Lateral: 3 cm

## 2023-01-23 MED ORDER — PERFLUTREN LIPID MICROSPHERE
1.0000 mL | INTRAVENOUS | Status: AC | PRN
  Administered 2023-01-23: 2 mL via INTRAVENOUS

## 2023-01-25 DIAGNOSIS — D124 Benign neoplasm of descending colon: Secondary | ICD-10-CM | POA: Diagnosis not present

## 2023-01-25 DIAGNOSIS — K648 Other hemorrhoids: Secondary | ICD-10-CM | POA: Diagnosis not present

## 2023-01-25 DIAGNOSIS — Z86010 Personal history of colonic polyps: Secondary | ICD-10-CM | POA: Diagnosis not present

## 2023-01-25 DIAGNOSIS — D125 Benign neoplasm of sigmoid colon: Secondary | ICD-10-CM | POA: Diagnosis not present

## 2023-01-25 DIAGNOSIS — Z09 Encounter for follow-up examination after completed treatment for conditions other than malignant neoplasm: Secondary | ICD-10-CM | POA: Diagnosis not present

## 2023-01-25 DIAGNOSIS — D122 Benign neoplasm of ascending colon: Secondary | ICD-10-CM | POA: Diagnosis not present

## 2023-01-27 ENCOUNTER — Ambulatory Visit (HOSPITAL_COMMUNITY)
Admission: RE | Admit: 2023-01-27 | Discharge: 2023-01-27 | Disposition: A | Discharge status: Home / Self Care | Payer: PPO | Source: Ambulatory Visit | Attending: Physician Assistant | Admitting: Physician Assistant

## 2023-01-27 ENCOUNTER — Other Ambulatory Visit: Payer: PPO

## 2023-01-27 DIAGNOSIS — I7121 Aneurysm of the ascending aorta, without rupture: Secondary | ICD-10-CM | POA: Diagnosis not present

## 2023-01-27 DIAGNOSIS — D122 Benign neoplasm of ascending colon: Secondary | ICD-10-CM | POA: Diagnosis not present

## 2023-01-27 DIAGNOSIS — D125 Benign neoplasm of sigmoid colon: Secondary | ICD-10-CM | POA: Diagnosis not present

## 2023-01-27 DIAGNOSIS — I700 Atherosclerosis of aorta: Secondary | ICD-10-CM | POA: Diagnosis not present

## 2023-01-27 MED ORDER — IOHEXOL 350 MG/ML SOLN
75.0000 mL | Freq: Once | INTRAVENOUS | Status: AC | PRN
  Administered 2023-01-27: 75 mL via INTRAVENOUS

## 2023-03-10 DIAGNOSIS — J301 Allergic rhinitis due to pollen: Secondary | ICD-10-CM | POA: Diagnosis not present

## 2023-03-10 DIAGNOSIS — G2581 Restless legs syndrome: Secondary | ICD-10-CM | POA: Diagnosis not present

## 2023-03-10 DIAGNOSIS — R7309 Other abnormal glucose: Secondary | ICD-10-CM | POA: Diagnosis not present

## 2023-03-10 DIAGNOSIS — R7303 Prediabetes: Secondary | ICD-10-CM | POA: Diagnosis not present

## 2023-03-10 DIAGNOSIS — E785 Hyperlipidemia, unspecified: Secondary | ICD-10-CM | POA: Diagnosis not present

## 2023-03-10 DIAGNOSIS — I700 Atherosclerosis of aorta: Secondary | ICD-10-CM | POA: Diagnosis not present

## 2023-03-10 DIAGNOSIS — I10 Essential (primary) hypertension: Secondary | ICD-10-CM | POA: Diagnosis not present

## 2023-03-10 DIAGNOSIS — G4700 Insomnia, unspecified: Secondary | ICD-10-CM | POA: Diagnosis not present

## 2023-06-23 ENCOUNTER — Other Ambulatory Visit: Payer: PPO

## 2023-06-23 ENCOUNTER — Other Ambulatory Visit: Payer: Self-pay

## 2023-06-23 DIAGNOSIS — R972 Elevated prostate specific antigen [PSA]: Secondary | ICD-10-CM

## 2023-06-24 LAB — PSA: Prostate Specific Ag, Serum: 9.5 ng/mL — ABNORMAL HIGH (ref 0.0–4.0)

## 2023-06-27 ENCOUNTER — Ambulatory Visit: Payer: PPO | Admitting: Urology

## 2023-06-27 VITALS — BP 122/80 | HR 81 | Ht 72.0 in | Wt 278.2 lb

## 2023-06-27 DIAGNOSIS — R310 Gross hematuria: Secondary | ICD-10-CM | POA: Diagnosis not present

## 2023-06-27 DIAGNOSIS — N400 Enlarged prostate without lower urinary tract symptoms: Secondary | ICD-10-CM

## 2023-06-27 LAB — MICROSCOPIC EXAMINATION

## 2023-06-27 LAB — URINALYSIS, COMPLETE
Bilirubin, UA: NEGATIVE
Glucose, UA: NEGATIVE
Ketones, UA: NEGATIVE
Leukocytes,UA: NEGATIVE
Nitrite, UA: NEGATIVE
Protein,UA: NEGATIVE
RBC, UA: NEGATIVE
Specific Gravity, UA: 1.025 (ref 1.005–1.030)
Urobilinogen, Ur: 0.2 mg/dL (ref 0.2–1.0)
pH, UA: 5.5 (ref 5.0–7.5)

## 2023-06-27 LAB — BLADDER SCAN AMB NON-IMAGING: Scan Result: 12

## 2023-06-27 NOTE — Patient Instructions (Signed)

## 2023-06-27 NOTE — Progress Notes (Signed)
Marcelle Overlie Plume,acting as a scribe for Vanna Scotland, MD.,have documented all relevant documentation on the behalf of Vanna Scotland, MD,as directed by  Vanna Scotland, MD while in the presence of Vanna Scotland, MD.  06/27/2023 10:23 AM   [name] 1951/05/07 [id number]  Referring provider: Laurann Montana, MD [phone] [place],  [place] [address]  Chief Complaint  Patient presents with   Benign Prostatic Hypertrophy    HPI: 72 year-old male who presents today for routine annual follow up.   He has a personal history of BPH status post TURP in 2017, gross hematuria, erectile dysfunction, and history of elevated PSA. His PSA tends to run in the 5-7 range. He is not on any BPH medications.   His most recent PSA on 06/23/2023 is markedly elevated to 9.5. It was as high as 8.6 in 2022.   His urinalysis today is negative.  He reports minimal urinary symptoms today. He reports that he started [facility] and [employer] and he noticed that he was passing blood clots in his urine. This resolved after discontinuing the medications.   He denies a smoking history.    Results for orders placed or performed in visit on 06/27/23  Bladder Scan (Post Void Residual) in office  Result Value Ref Range   Scan Result 12 ml     IPSS     Row Name 06/27/23 0900         International Prostate Symptom Score   How often have you had the sensation of not emptying your bladder? Less than 1 in 5     How often have you had to urinate less than every two hours? Not at All     How often have you found you stopped and started again several times when you urinated? Less than 1 in 5 times     How often have you found it difficult to postpone urination? Not at All     How often have you had a weak urinary stream? Less than 1 in 5 times     How often have you had to strain to start urination? Not at All     How many times did you typically get up at night to urinate? 1 Time     Total IPSS  Score 4       Quality of Life due to urinary symptoms   If you were to spend the rest of your life with your urinary condition just the way it is now how would you feel about that? 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   Lacunar infarct, acute (HCC)    Left sided sciatica    Morbid obesity due to excess calories (HCC)    Nonalcoholic steatohepatitis    Palpitations    Prediabetes    RLS (restless legs syndrome)  SBO (small bowel obstruction) (HCC) 04/21/2017   Situational stress    Urinary retention 11/17/2015   Ventral hernia without obstruction or gangrene 05/11/2017   Added automatically from request for surgery [id number]    Surgical History: Past Surgical History:  Procedure Laterality Date   HERNIA REPAIR  2018   TRANSURETHRAL RESECTION OF PROSTATE  2017    Home Medications:  Allergies as of 06/27/2023       Reactions   Amoxicillin    Other reaction(s): rash?   Breo Ellipta [fluticasone Furoate-vilanterol]    Blood in the urine   Prednisone Other (See Comments)   Very bad mood swings   Stiolto Respimat [tiotropium Bromide-olodaterol]    Blood in the urine   Tadalafil    Other reaction(s): ha   Tramadol Hcl    Other reaction(s): difficulty urinating        Medication List        Accurate as of June 27, 2023 10:23 AM. If you have any questions, ask your nurse or doctor.          STOP taking these medications    carvedilol 3.125 MG  tablet Commonly known as: COREG Stopped by: Vanna Scotland   diazepam 10 MG tablet Commonly known as: VALIUM Stopped by: Vanna Scotland   diclofenac 75 MG EC tablet Commonly known as: VOLTAREN Stopped by: Vanna Scotland   Symbicort 160-4.5 MCG/ACT inhaler Generic drug: budesonide-formoterol Stopped by: Vanna Scotland   tadalafil 20 MG tablet Commonly known as: CIALIS Stopped by: Vanna Scotland       TAKE these medications    aspirin 325 MG buffered tablet Take 325 mg by mouth daily.   famotidine 10 MG tablet Commonly known as: PEPCID Take 1 tablet by mouth as needed.   fish oil-omega-3 fatty acids 1000 MG capsule Take 2 g by mouth daily.   lisinopril-hydrochlorothiazide 20-12.5 MG tablet Commonly known as: ZESTORETIC   loratadine 10 MG tablet Commonly known as: CLARITIN   multivitamin capsule Take 1 capsule by mouth daily.   pravastatin 40 MG tablet Commonly known as: PRAVACHOL Take 40 mg by mouth daily.   rOPINIRole 1 MG tablet Commonly known as: REQUIP Take 1 mg by mouth 2 (two) times daily.   tiZANidine 4 MG tablet Commonly known as: ZANAFLEX Take 4 mg by mouth at bedtime.   zolpidem 10 MG tablet Commonly known as: AMBIEN Take 10 mg by mouth as needed for sleep.        Allergies:  Allergies  Allergen Reactions   Amoxicillin     Other reaction(s): rash?   Breo Ellipta [Fluticasone Furoate-Vilanterol]     Blood in the urine   Prednisone Other (See Comments)    Very bad mood swings   Stiolto Respimat [Tiotropium Bromide-Olodaterol]     Blood in the urine   Tadalafil     Other reaction(s): ha   Tramadol Hcl     Other reaction(s): difficulty urinating    Family History: Family History  Problem Relation Age of Onset   Hypertension Mother    Cerebral aneurysm Father    Aortic aneurysm Father    Aortic aneurysm Paternal Grandfather    Aortic aneurysm Paternal Uncle    Prostate cancer Neg Hx    Kidney cancer Neg Hx     Social  History:  reports that he has never smoked. He has never used smokeless tobacco. He reports that he does not drink alcohol and does not use drugs.   Physical Exam: BP  122/80   Pulse 81   Ht 6' (1.829 m)   Wt 278 lb 4 oz (126.2 kg)   BMI 37.74 kg/m   Constitutional:  Alert and oriented, No acute distress. HEENT: [place] AT, moist mucus membranes.  Trachea midline, no masses. Rectal: Enlarged prostate, smooth and symmetric, no nodularity Neurologic: Grossly intact, no focal deficits, moving all 4 extremities. Psychiatric: Normal mood and affect.   Assessment & Plan:    1. Elevated PSA - Benign DRE today - Consider biopsy versus prostate MRI - Repeat PSA in 3 months  2. BPH - Overall satisfied with his urinary symptoms today  3. Gross hematuria - Resolved after discontinuing medications -Medications may cause difficulty urinating although does not endorse this, should not cause blood in the urine.  This is to be evaluated. - Recommend CT and cystoscopy to rule out bladder or kidney pathology   Return in about 3 months (around 09/26/2023) for repeat PSA.  I have reviewed the above documentation for accuracy and completeness, and I agree with the above.   Vanna Scotland, MD   [facility] [address] [place], [place] [address] [phone]

## 2023-07-08 IMAGING — US US ABDOMINAL AORTA SCREENING AAA
1 series · 14 of 17 positions shown · non-contrast
Comparison: None.

CLINICAL DATA: Patient between 65-75 years of age with family
history of AAA.

EXAM:
US ABDOMINAL AORTA MEDICARE SCREENING
TECHNIQUE: Ultrasound examination of the abdominal aorta was performed as a
screening evaluation for abdominal aortic aneurysm.

[Series 1: us abdominal aorta screening aaa · 0.31mm/px · 14 of 17 slices shown]
[im 1/17]
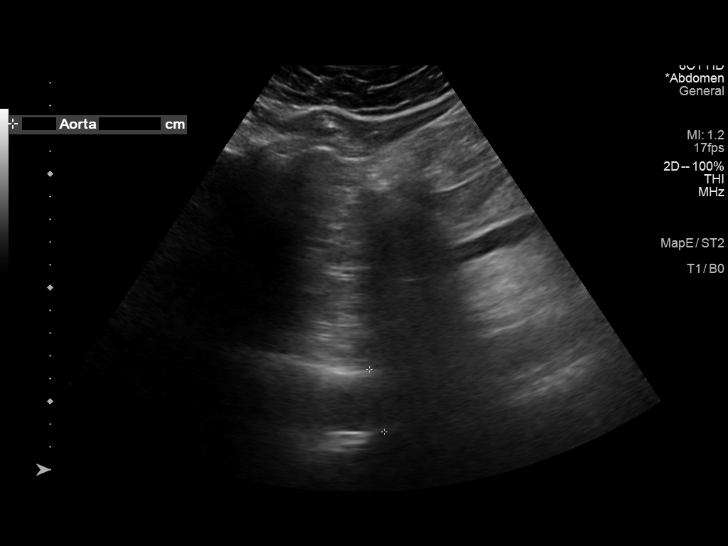
[im 2/17]
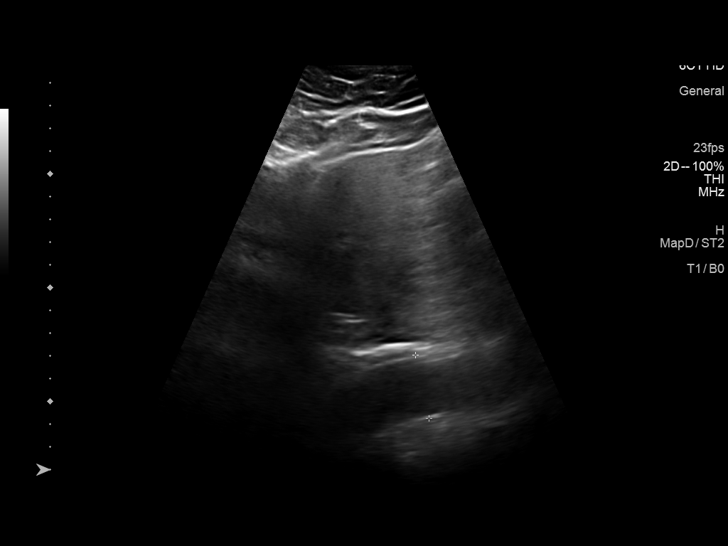
[im 4/17]
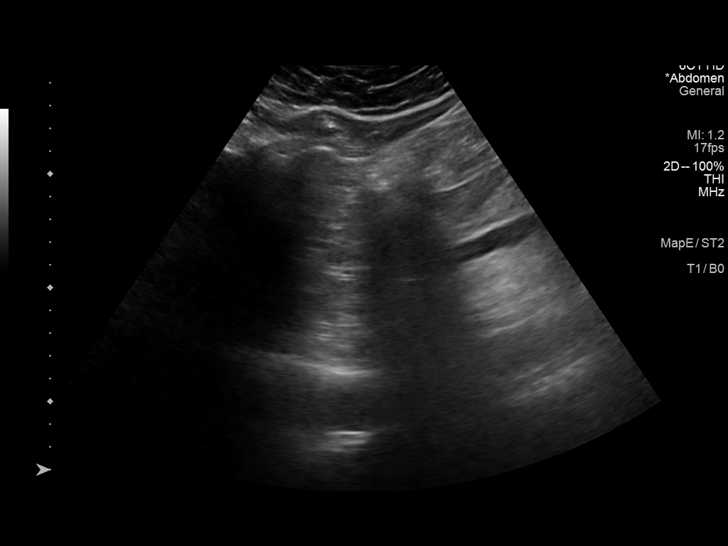
[im 5/17]
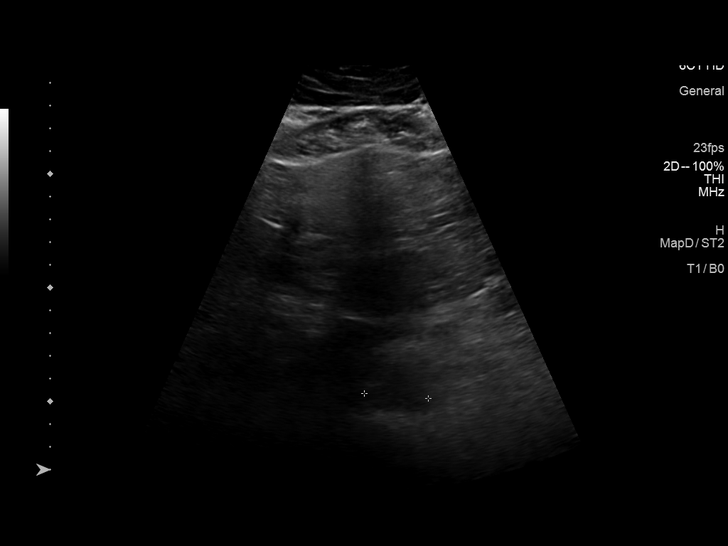
[im 6/17]
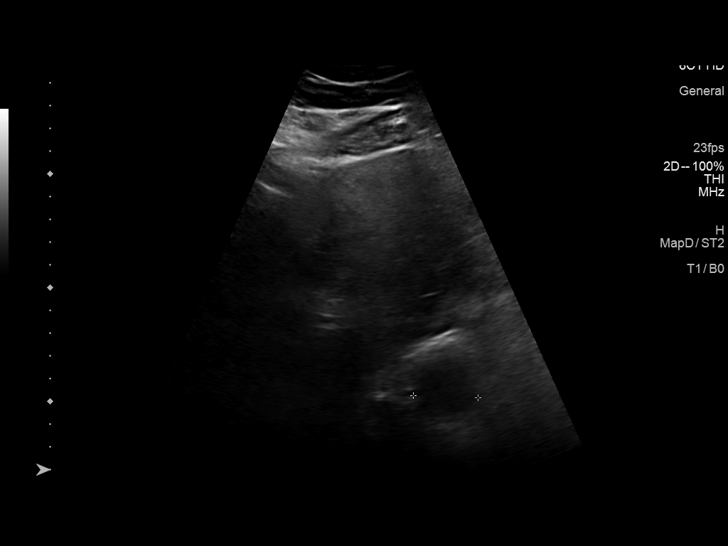
[im 7/17]
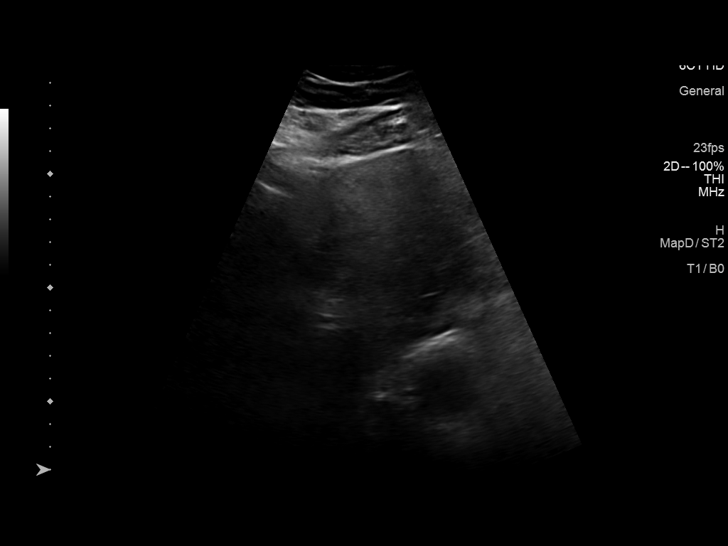
[im 8/17]
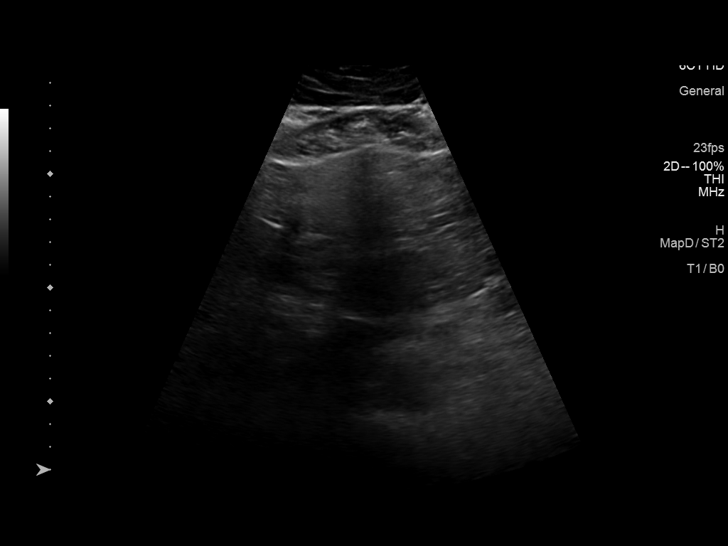
[im 10/17]
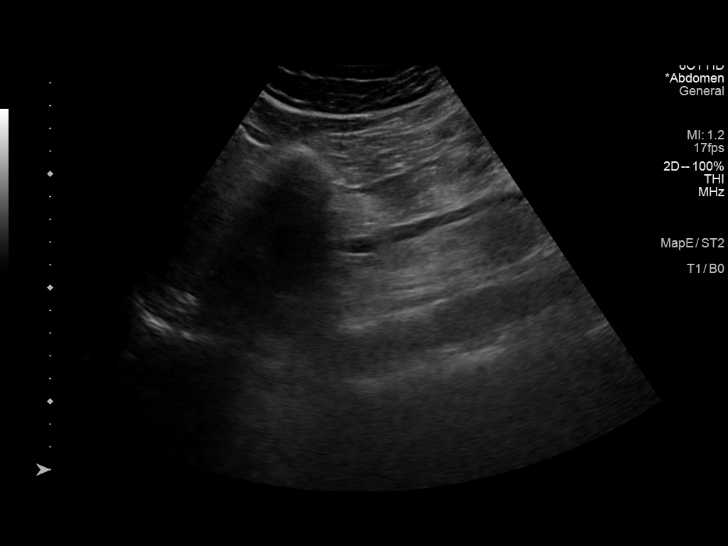
[im 11/17]
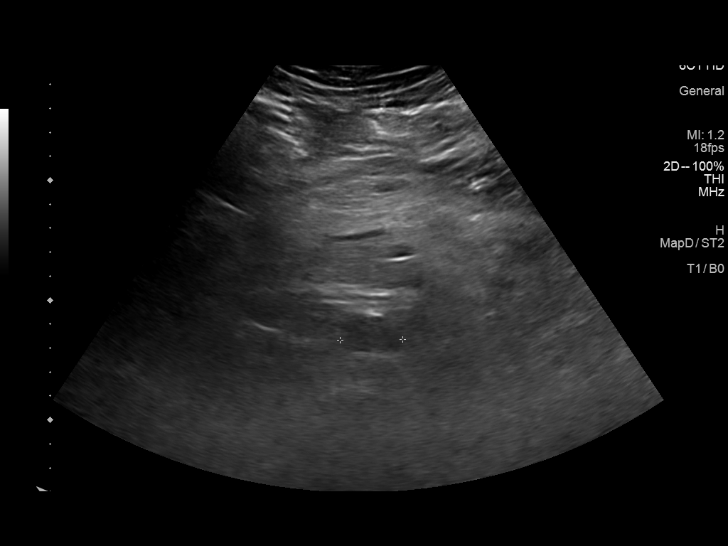
[im 12/17]
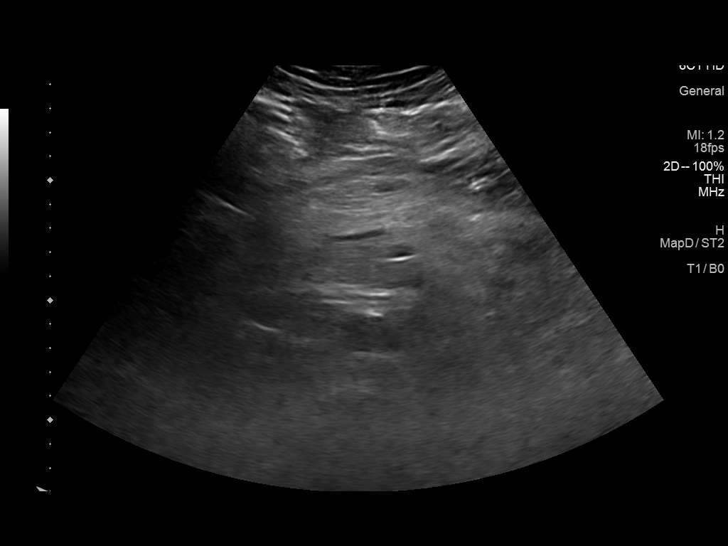
[im 13/17]
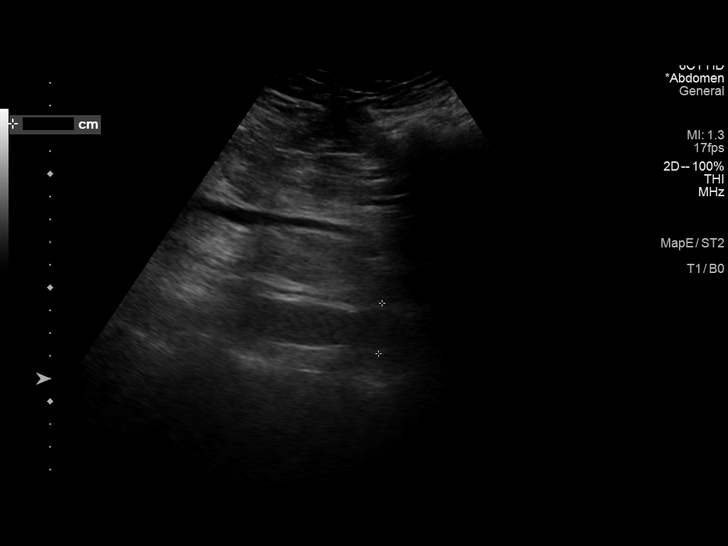
[im 14/17]
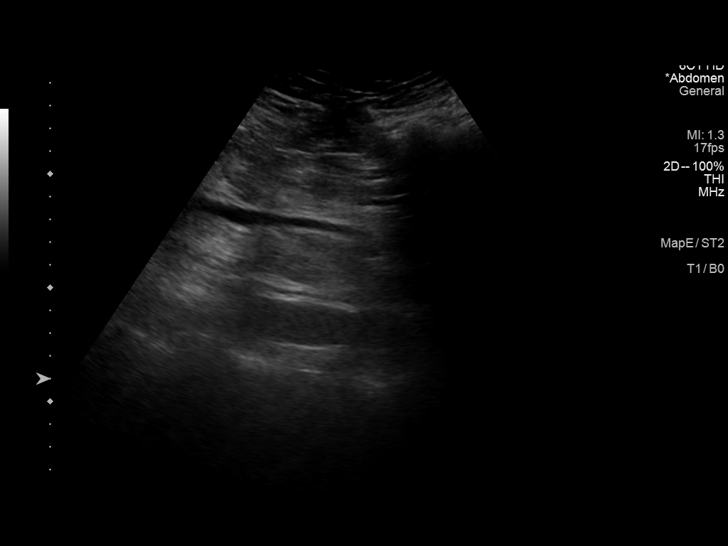
[im 16/17]
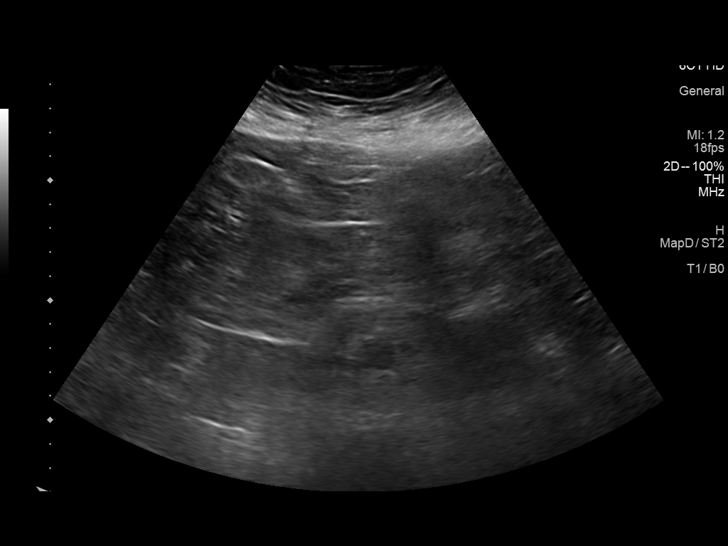
[im 17/17]
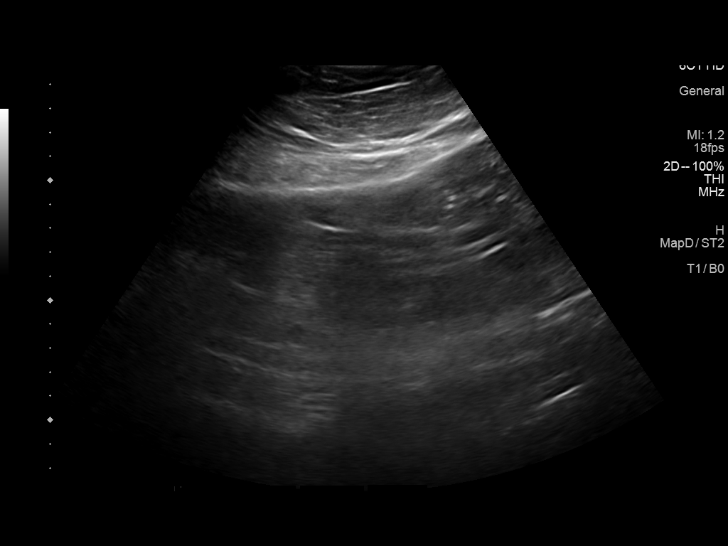

[14 of 17 positions shown; findings below may reference images not displayed]

FINDINGS: Abdominal aortic measurements as follows:

Proximal:  2.8 x 2.8 cm

Mid:  2.6 x 2.7 cm

Distal:  1.7 x 2.2 cm

Limited visualization of the abdominal aorta due to body habitus and
bowel gas. There is evidence of atherosclerosis.
IMPRESSION: Atherosclerosis of the abdominal aorta without evidence of
aneurysmal disease.

## 2023-07-23 ENCOUNTER — Ambulatory Visit (HOSPITAL_BASED_OUTPATIENT_CLINIC_OR_DEPARTMENT_OTHER)
Admission: RE | Admit: 2023-07-23 | Discharge: 2023-07-23 | Disposition: A | Discharge status: Home / Self Care | Payer: PPO | Source: Ambulatory Visit | Attending: Urology | Admitting: Urology

## 2023-07-23 DIAGNOSIS — N400 Enlarged prostate without lower urinary tract symptoms: Secondary | ICD-10-CM | POA: Diagnosis not present

## 2023-07-23 DIAGNOSIS — K4020 Bilateral inguinal hernia, without obstruction or gangrene, not specified as recurrent: Secondary | ICD-10-CM | POA: Diagnosis not present

## 2023-07-23 DIAGNOSIS — R310 Gross hematuria: Secondary | ICD-10-CM | POA: Diagnosis not present

## 2023-07-23 MED ORDER — IOHEXOL 300 MG/ML  SOLN
100.0000 mL | Freq: Once | INTRAMUSCULAR | Status: AC | PRN
  Administered 2023-07-23: 100 mL via INTRAVENOUS

## 2023-07-25 ENCOUNTER — Ambulatory Visit: Payer: PPO | Admitting: Urology

## 2023-07-25 VITALS — BP 129/82 | HR 73 | Ht 72.0 in | Wt 282.4 lb

## 2023-07-25 DIAGNOSIS — R310 Gross hematuria: Secondary | ICD-10-CM

## 2023-07-25 DIAGNOSIS — R3129 Other microscopic hematuria: Secondary | ICD-10-CM

## 2023-07-25 DIAGNOSIS — N400 Enlarged prostate without lower urinary tract symptoms: Secondary | ICD-10-CM

## 2023-07-25 DIAGNOSIS — R972 Elevated prostate specific antigen [PSA]: Secondary | ICD-10-CM

## 2023-07-25 LAB — URINALYSIS, COMPLETE
Bilirubin, UA: NEGATIVE
Glucose, UA: NEGATIVE
Ketones, UA: NEGATIVE
Leukocytes,UA: NEGATIVE
Nitrite, UA: NEGATIVE
Protein,UA: NEGATIVE
RBC, UA: NEGATIVE
Specific Gravity, UA: 1.025 (ref 1.005–1.030)
Urobilinogen, Ur: 0.2 mg/dL (ref 0.2–1.0)
pH, UA: 5.5 (ref 5.0–7.5)

## 2023-07-25 LAB — MICROSCOPIC EXAMINATION

## 2023-07-25 NOTE — Addendum Note (Signed)
Addended by: Consuella Lose on: 07/25/2023 09:20 AM   Modules accepted: Orders

## 2023-07-25 NOTE — Progress Notes (Signed)
   07/25/23  CC:  Chief Complaint  Patient presents with   Cysto    HPI: 72 year old male who presents today for cystoscopy following episode of gross hematuria.  CT urogram was personally reviewed today, no obvious GU pathology identified other than prostamegaly.  Awaiting final radiologic interpretation.  He mentions today that he remains fairly asymptomatic has not had another episode of gross hematuria since.  He has a personal history of BPH status post TURP in 2017, gross hematuria, erectile dysfunction, and history of elevated PSA. His PSA tends to run in the 5-7 range. He is not on any BPH medications.    His most recent PSA on 06/23/2023 is markedly elevated to 9.5. It was as high as 8.6 in 2022.   Blood pressure 129/82, pulse 73, height 6' (1.829 m), weight 282 lb 6 oz (128.1 kg). NED. A&Ox3.   No respiratory distress   Abd soft, NT, ND Normal phallus with bilateral descended testicles  Cystoscopy Procedure Note  Patient identification was confirmed, informed consent was obtained, and patient was prepped using Betadine solution.  Lidocaine jelly was administered per urethral meatus.     Pre-Procedure: - Inspection reveals a normal caliber ureteral meatus.  Procedure: The flexible cystoscope was introduced without difficulty - No urethral strictures/lesions are present. - Enlarged prostate slightly irregular prostatic fossa with bilobar coaptation, friable prostatic mucosa with bleeding noted with manipulation - Elevated bladder neck - Bilateral ureteral orifices identified - Bladder mucosa  reveals no ulcers, tumors, or lesions - No bladder stones - Mild trabeculation  Retroflexion shows TURP defect with prostatic regrowth noted within the defect.  Hypervascularity also appreciated.   Post-Procedure: - Patient tolerated the procedure well  Assessment/ Plan:  1. Gross hematuria Likely secondary to prostatic regrowth  Will call with final radiologic  interpretation this is different than my own assessment  Given episode of gross hematuria that is infrequent and not bothersome, hold off on starting finasteride but consider if episodes increase in frequency - Urinalysis, Complete  2. Elevated PSA Plan for recheck in a few months, likely related to prostamegaly and prostatic regrowth  3. BPH without obstruction/lower urinary tract symptoms Minimally symptomatic    Repeat PSA in December, follow-up with me annually with IPSS/PVR/PSA/DRE  Vanna Scotland, MD

## 2023-07-27 LAB — POCT I-STAT CREATININE: Creatinine, Ser: 1 mg/dL (ref 0.61–1.24)

## 2023-09-11 DIAGNOSIS — E785 Hyperlipidemia, unspecified: Secondary | ICD-10-CM | POA: Diagnosis not present

## 2023-09-11 DIAGNOSIS — I10 Essential (primary) hypertension: Secondary | ICD-10-CM | POA: Diagnosis not present

## 2023-09-11 DIAGNOSIS — R7303 Prediabetes: Secondary | ICD-10-CM | POA: Diagnosis not present

## 2023-09-14 DIAGNOSIS — R21 Rash and other nonspecific skin eruption: Secondary | ICD-10-CM | POA: Diagnosis not present

## 2023-09-14 DIAGNOSIS — E785 Hyperlipidemia, unspecified: Secondary | ICD-10-CM | POA: Diagnosis not present

## 2023-09-14 DIAGNOSIS — J301 Allergic rhinitis due to pollen: Secondary | ICD-10-CM | POA: Diagnosis not present

## 2023-09-14 DIAGNOSIS — Z862 Personal history of diseases of the blood and blood-forming organs and certain disorders involving the immune mechanism: Secondary | ICD-10-CM | POA: Diagnosis not present

## 2023-09-14 DIAGNOSIS — I700 Atherosclerosis of aorta: Secondary | ICD-10-CM | POA: Diagnosis not present

## 2023-09-14 DIAGNOSIS — Z23 Encounter for immunization: Secondary | ICD-10-CM | POA: Diagnosis not present

## 2023-09-14 DIAGNOSIS — G4700 Insomnia, unspecified: Secondary | ICD-10-CM | POA: Diagnosis not present

## 2023-09-14 DIAGNOSIS — Z0000 Encounter for general adult medical examination without abnormal findings: Secondary | ICD-10-CM | POA: Diagnosis not present

## 2023-09-14 DIAGNOSIS — I10 Essential (primary) hypertension: Secondary | ICD-10-CM | POA: Diagnosis not present

## 2023-09-14 DIAGNOSIS — K7581 Nonalcoholic steatohepatitis (NASH): Secondary | ICD-10-CM | POA: Diagnosis not present

## 2023-09-14 DIAGNOSIS — G2581 Restless legs syndrome: Secondary | ICD-10-CM | POA: Diagnosis not present

## 2023-09-15 ENCOUNTER — Other Ambulatory Visit: Payer: PPO

## 2023-09-15 DIAGNOSIS — N400 Enlarged prostate without lower urinary tract symptoms: Secondary | ICD-10-CM | POA: Diagnosis not present

## 2023-09-16 LAB — PSA: Prostate Specific Ag, Serum: 6.8 ng/mL — ABNORMAL HIGH (ref 0.0–4.0)

## 2023-09-21 ENCOUNTER — Other Ambulatory Visit: Payer: PPO

## 2023-12-13 DIAGNOSIS — E785 Hyperlipidemia, unspecified: Secondary | ICD-10-CM | POA: Diagnosis not present

## 2023-12-13 DIAGNOSIS — I700 Atherosclerosis of aorta: Secondary | ICD-10-CM | POA: Diagnosis not present

## 2023-12-13 DIAGNOSIS — E1169 Type 2 diabetes mellitus with other specified complication: Secondary | ICD-10-CM | POA: Diagnosis not present

## 2023-12-13 DIAGNOSIS — R29818 Other symptoms and signs involving the nervous system: Secondary | ICD-10-CM | POA: Diagnosis not present

## 2023-12-13 DIAGNOSIS — I10 Essential (primary) hypertension: Secondary | ICD-10-CM | POA: Diagnosis not present

## 2023-12-18 DIAGNOSIS — E785 Hyperlipidemia, unspecified: Secondary | ICD-10-CM | POA: Diagnosis not present

## 2023-12-18 DIAGNOSIS — Z6839 Body mass index (BMI) 39.0-39.9, adult: Secondary | ICD-10-CM | POA: Diagnosis not present

## 2023-12-18 DIAGNOSIS — I10 Essential (primary) hypertension: Secondary | ICD-10-CM | POA: Diagnosis not present

## 2023-12-18 DIAGNOSIS — E1169 Type 2 diabetes mellitus with other specified complication: Secondary | ICD-10-CM | POA: Diagnosis not present

## 2024-01-10 IMAGING — CT CT CARDIAC CORONARY ARTERY CALCIUM SCORE
3 series · 14 of 20 positions shown, 16 images · non-contrast
Comparison: None.

Addendum:
CLINICAL DATA: 71M for cardiovascular disease risk stratification

EXAM:
Coronary Calcium Score
TECHNIQUE: A gated, non-contrast computed tomography scan of the heart was
performed using 3mm slice thickness. Axial images were analyzed on a
dedicated workstation. Calcium scoring of the coronary arteries was
performed using the Agatston method.

[Series 2: ax lung · axial · 0.96mm/px · z∈[+43,+133]mm · 5 of 69 slices shown]
[im 12/69  lung]
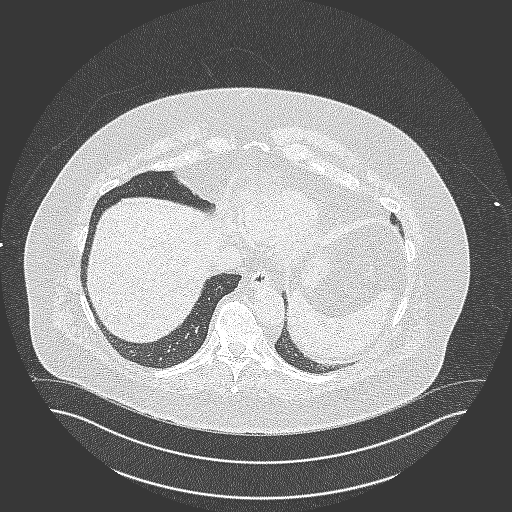
[im 23/69  lung]
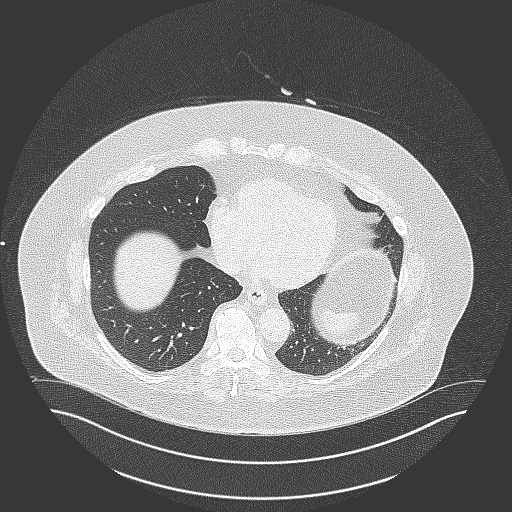
[im 35/69  lung]
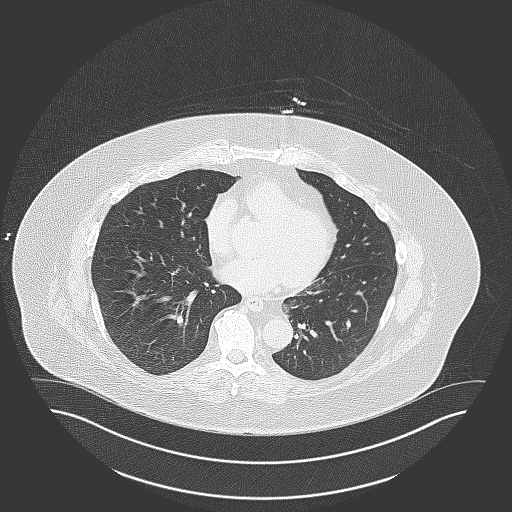
[im 46/69  lung]
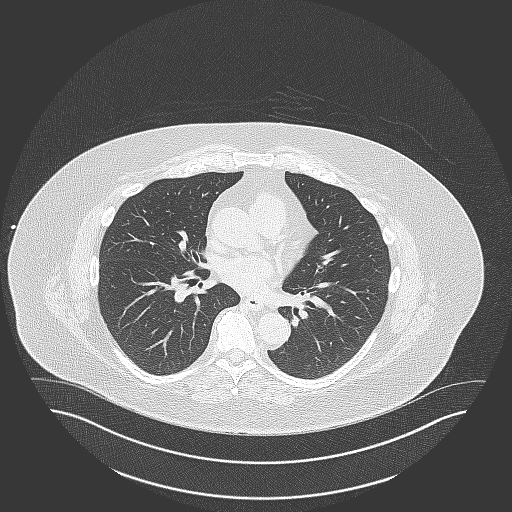
[im 57/69  lung]
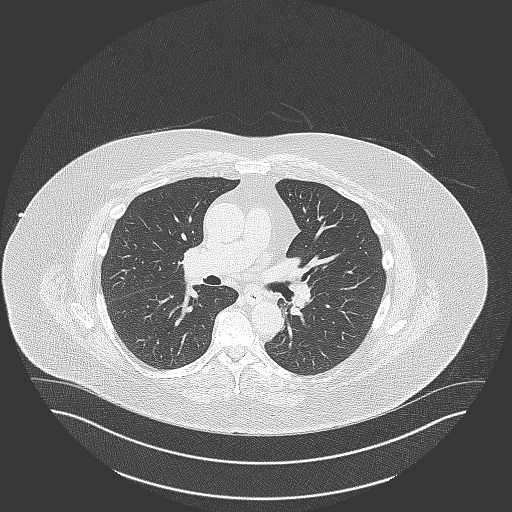

[Series 3: cascseq 3.0 sa36 70% (id) · axial · 0.41mm/px · z∈[+54,+120]mm · 3 of 46 slices shown]
[im 12/46  vessel]
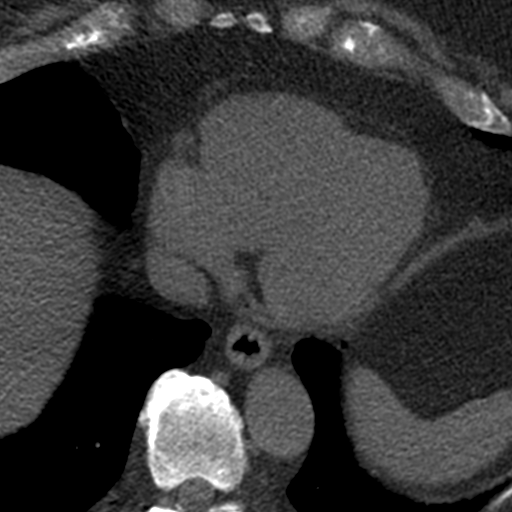
[im 23/46  vessel]
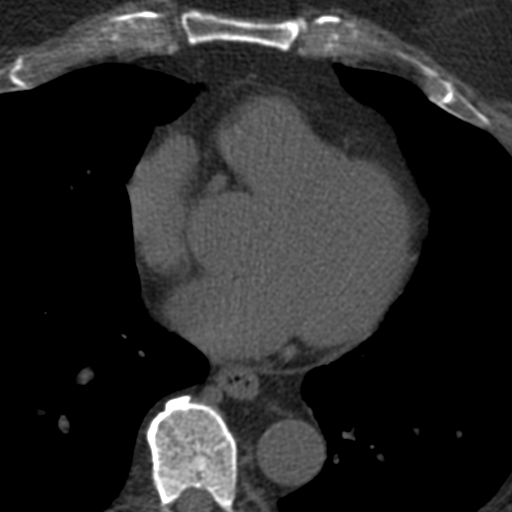
[im 34/46  vessel]
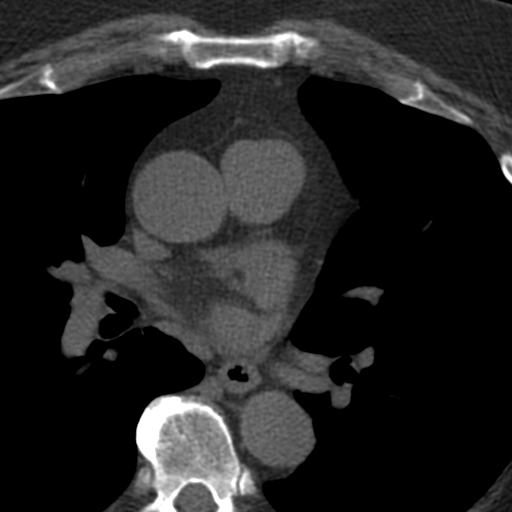

[Series 4: ax st · axial · 0.96mm/px · z∈[+39,+137]mm · 6 of 69 slices shown, 8 images]
[im 10/69  vessel]
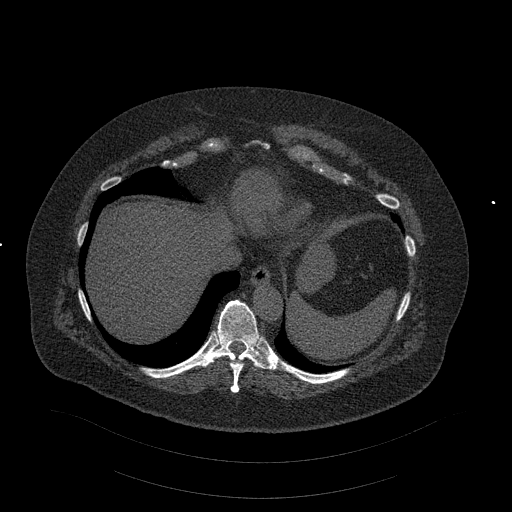
[im 10/69  lung]
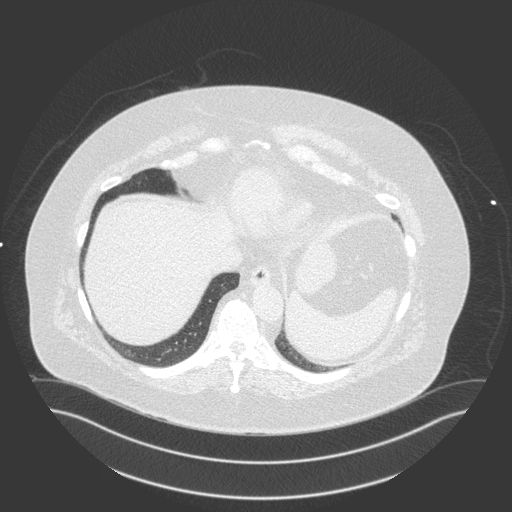
[im 20/69  vessel]
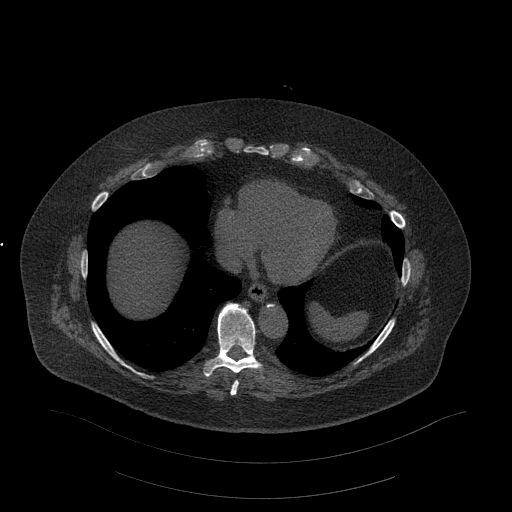
[im 30/69  vessel]
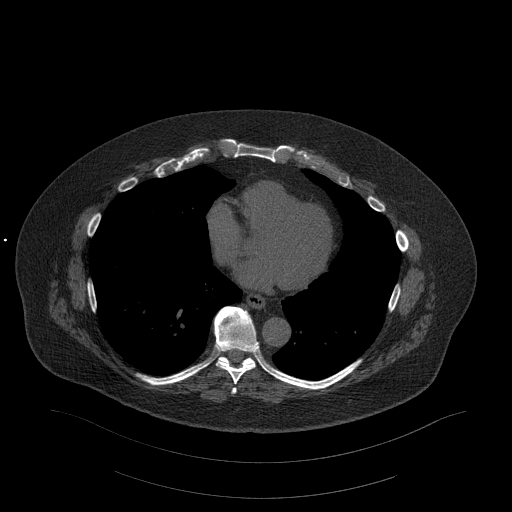
[im 39/69  vessel]
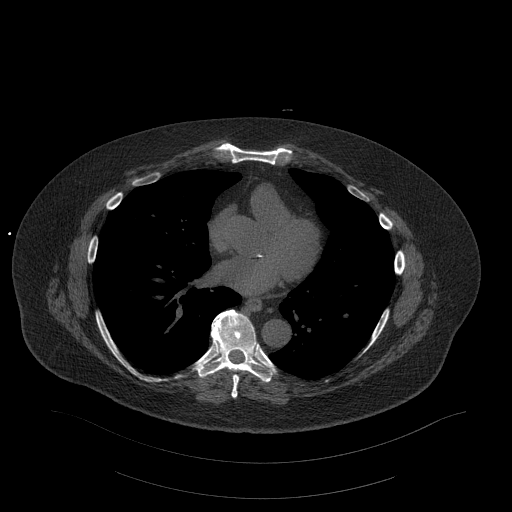
[im 49/69  vessel]
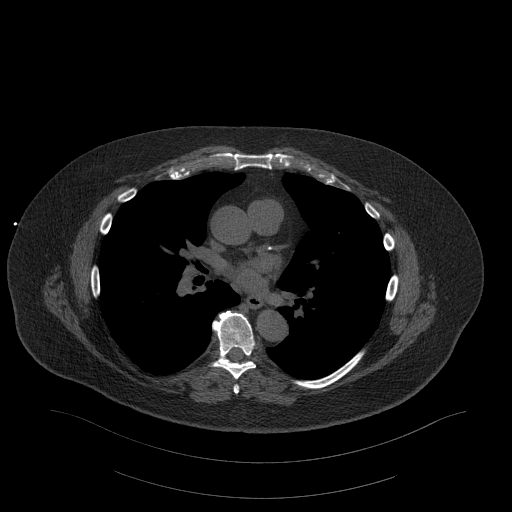
[im 49/69  lung]
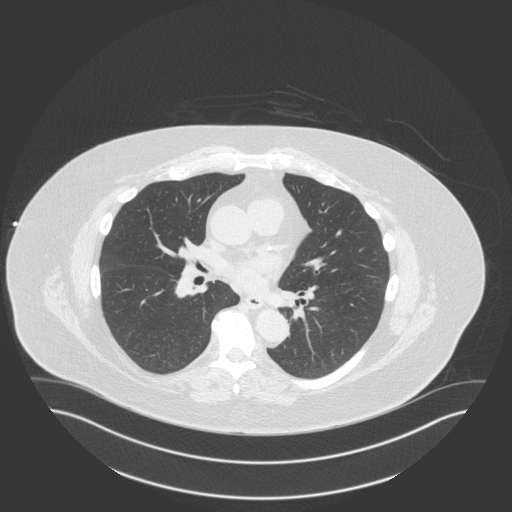
[im 59/69  vessel]
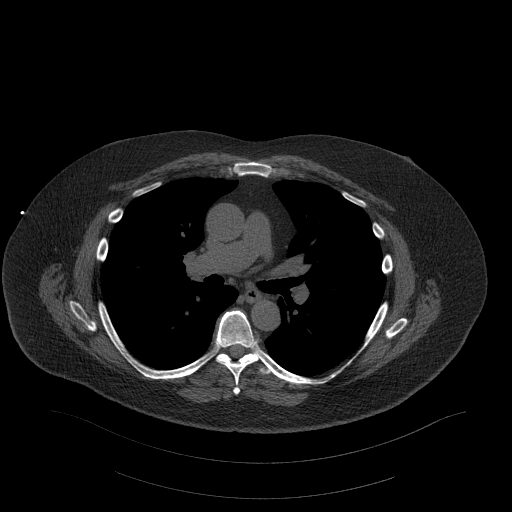

[14 of 20 positions shown; findings below may reference images not displayed]

FINDINGS: Coronary arteries: Normal origins.

Coronary Calcium Score: 0

Pericardium: Normal.

Ascending Aorta: Normal caliber. Mild atherosclerosis of the aortic
root.

Non-cardiac: See separate report from [REDACTED].
IMPRESSION: Coronary calcium score of 0. This was 0 percentile for age-, race-,
and sex-matched controls.

Mild atherosclerosis of the aortic root.



If CAC=0, it is reasonable to withhold statin therapy and reassess
in 5 to 10 years, as long as higher risk conditions are absent
(diabetes mellitus, family history of premature CHD in first degree
relatives (males <55 years; females <65 years), cigarette smoking,
or LDL >=190 mg/dL).

If CAC is 1 to 99, it is reasonable to initiate statin therapy for
patients >=55 years of age.

If CAC is >=100 or >=75th percentile, it is reasonable to initiate
statin therapy at any age.

Cardiology referral should be considered for patients with CAC
scores >=400 or >=75th percentile.

*2301 AHA/ACC/AACVPR/AAPA/ABC/CON/DOTTE COMME/STEWARD/Hafizhah/ARNEL/ALOUANE/ANNEKE
Guideline on the Management of Blood Cholesterol: A Report of the
American College of Cardiology/American Heart Association Task Force
on Clinical Practice Guidelines. J Am Coll Cardiol.
0269;73(24):8499-8044.

EXAM:
OVER-READ INTERPRETATION  CT CHEST

The following report is an over-read performed by radiologist Dr.
does not include interpretation of cardiac or coronary anatomy or
pathology. The coronary calcium score interpretation by the
cardiologist is attached.
FINDINGS: Vascular: No significant extracardiac vascular findings. There are
coronary artery calcifications.

Mediastinum/Nodes: No lymphadenopathy.

Lungs/Pleura: No focal airspace disease. There is a solid 4 mm left
lower lobe pulmonary nodule (series 2, image 28).

Upper Abdomen: No acute abnormality.

Musculoskeletal: No acute osseous abnormality. No suspicious osseous
lesion.
IMPRESSION: Solid 4 mm left lower lobe pulmonary nodule. No follow-up needed if
patient is low-risk.This recommendation follows the consensus
statement: Guidelines for Management of Incidental Pulmonary Nodules
Detected on CT Images: From the [HOSPITAL] 8488; Radiology
8488; [DATE].

*** End of Addendum ***
FINDINGS: Coronary arteries: Normal origins.

Coronary Calcium Score: 0

Pericardium: Normal.

Ascending Aorta: Normal caliber. Mild atherosclerosis of the aortic
root.

Non-cardiac: See separate report from [REDACTED].
IMPRESSION: Coronary calcium score of 0. This was 0 percentile for age-, race-,
and sex-matched controls.

Mild atherosclerosis of the aortic root.



If CAC=0, it is reasonable to withhold statin therapy and reassess
in 5 to 10 years, as long as higher risk conditions are absent
(diabetes mellitus, family history of premature CHD in first degree
relatives (males <55 years; females <65 years), cigarette smoking,
or LDL >=190 mg/dL).

If CAC is 1 to 99, it is reasonable to initiate statin therapy for
patients >=55 years of age.

If CAC is >=100 or >=75th percentile, it is reasonable to initiate
statin therapy at any age.

Cardiology referral should be considered for patients with CAC
scores >=400 or >=75th percentile.

*2301 AHA/ACC/AACVPR/AAPA/ABC/CON/DOTTE COMME/STEWARD/Hafizhah/ARNEL/ALOUANE/ANNEKE
Guideline on the Management of Blood Cholesterol: A Report of the
American College of Cardiology/American Heart Association Task Force
on Clinical Practice Guidelines. J Am Coll Cardiol.
0269;73(24):8499-8044.

## 2024-03-11 ENCOUNTER — Ambulatory Visit: Attending: Family Medicine | Admitting: Audiology

## 2024-03-11 DIAGNOSIS — H903 Sensorineural hearing loss, bilateral: Secondary | ICD-10-CM | POA: Diagnosis not present

## 2024-03-12 NOTE — Procedures (Signed)
  Outpatient Audiology and [facility] [address] [place], [place]  [address] [phone]  AUDIOLOGICAL  EVALUATION  NAME: Eric Mckenzie     DOB:   September 27, 1951      MRN: [id number]                                                                                     DATE: 03/12/2024     REFERENT: Victorio Grave, MD STATUS: Outpatient DIAGNOSIS: sensorineural hearing loss, bilateral   History: Eric Mckenzie was seen for an audiological evaluation due to concerns regarding his hearing sensitivity. Eric Mckenzie was accompanied to the appointment by his wife. Eric Mckenzie reports a history of occupational noise exposure. He reports he sometimes utilized hearing protection at work. Eric Mckenzie has a history of firearm use and reports utilizing hearing protection while shooting. Eric Mckenzie reports a history of dizziness and balance issues. He denies otalgia, tinnitus, and aural fullness.   Evaluation:  Otoscopy showed a clear view of the tympanic membranes, bilaterally Tympanometry results were consistent with normal middle ear function (Type A), bilaterally.  Audiometric testing was completed using Conventional Audiometry techniques with insert earphones and TDH headphones. Test results are consistent with normal hearing sensitivity sloping to a moderately-severe sensorineural hearing loss. There is a sensorineural asymmetry noted at 2000-6000 Hz, worse in the left ear. Speech Recognition Thresholds were obtained at 20 dB HL in the right ear and at 20  dB HL in the left ear. Word Recognition Testing was completed at 70 dB HL and Hartwell scored 100%, bilaterally.     Results:  The test results were reviewed with Eric Mckenzie and his wife. Test results are consistent with normal hearing sensitivity sloping to a moderately-severe sensorineural hearing loss. There is a sensorineural asymmetry noted at 2000-6000 Hz, worse in the left ear. Eric Mckenzie may have hearing and communication difficulty in adverse listening  environments. Eric Mckenzie will benefit from the use of good communication strategies. Eric Mckenzie may only see minimal benefit from the use of hearing aids at this time.   Recommendations: 1.   Use of good communication strategies.  2.   Return in 2 years for an audiological evaluation for monitoring.    30 minutes spent testing and counseling on results.   If you have any questions please feel free to contact me at [phone].  Bert Britain Audiologist, Au.D., CCC-A 03/12/2024  10:07 AM  Cc: Victorio Grave, MD

## 2024-04-11 DIAGNOSIS — E785 Hyperlipidemia, unspecified: Secondary | ICD-10-CM | POA: Diagnosis not present

## 2024-04-11 DIAGNOSIS — E1169 Type 2 diabetes mellitus with other specified complication: Secondary | ICD-10-CM | POA: Diagnosis not present

## 2024-04-11 DIAGNOSIS — I10 Essential (primary) hypertension: Secondary | ICD-10-CM | POA: Diagnosis not present

## 2024-04-11 DIAGNOSIS — G4700 Insomnia, unspecified: Secondary | ICD-10-CM | POA: Diagnosis not present

## 2024-04-11 DIAGNOSIS — G2581 Restless legs syndrome: Secondary | ICD-10-CM | POA: Diagnosis not present

## 2024-04-11 DIAGNOSIS — I700 Atherosclerosis of aorta: Secondary | ICD-10-CM | POA: Diagnosis not present

## 2024-06-05 DIAGNOSIS — H5213 Myopia, bilateral: Secondary | ICD-10-CM | POA: Diagnosis not present

## 2024-06-05 DIAGNOSIS — H25813 Combined forms of age-related cataract, bilateral: Secondary | ICD-10-CM | POA: Diagnosis not present

## 2024-06-05 DIAGNOSIS — E119 Type 2 diabetes mellitus without complications: Secondary | ICD-10-CM | POA: Diagnosis not present

## 2024-06-17 ENCOUNTER — Other Ambulatory Visit: Payer: Self-pay

## 2024-06-17 DIAGNOSIS — R972 Elevated prostate specific antigen [PSA]: Secondary | ICD-10-CM

## 2024-06-18 ENCOUNTER — Encounter: Payer: Self-pay | Admitting: Urology

## 2024-07-10 DIAGNOSIS — R972 Elevated prostate specific antigen [PSA]: Secondary | ICD-10-CM | POA: Insufficient documentation

## 2024-07-10 NOTE — Assessment & Plan Note (Addendum)
 PSA 5-9 range, stable over ~4-5 years PSA 7.8 (Oct 2025), PSAD 0.05  I reviewed his clinical history and PSA data over several years.  He maintains a chronically elevated PSA secondary to 140 g BPH.  Recent PSA of 7.8 is very stable.  We may continue annual screening for now, although consider phasing out routine PSA as he reaches mid 70s.   - PSA in 1 year with follow up

## 2024-07-10 NOTE — Assessment & Plan Note (Addendum)
 Hx of BPH   - s/p TURP in 2017   - 140-150g gland by CT estimation (2024)   - No current medical therapy  PVR 5 cc today  Overall doing well, stable urinary habits, patient is pleased.  We discussed the possibility of adding back an alpha-blocker or finasteride in the future if symptoms progress.

## 2024-07-10 NOTE — Progress Notes (Signed)
 07/22/2024 9:28 AM   Eric Mckenzie 18-Aug-1951 [id number]  Reason for visit: Follow up BPH, elevated PSA   HPI: 73 y.o. male, initial follow up with me today, previously seen by Dr. Penne in Oct 2024  Prior HPI: Hx of GH   - s/p normal CTU and cystoscopy (2024)   Hx of BPH   - s/p TURP in 2017  Hx of elevated PSA    - PSA 5-9 range   - PSA to 9.5 (Sept 2024)    Physical Exam: BP 130/81 (BP Location: Left Arm, Patient Position: Sitting, Cuff Size: Large)   Pulse 69   Ht 6' (1.829 m)   Wt 264 lb (119.7 kg)   SpO2 93%   BMI 35.80 kg/m    Constitutional:  Alert and oriented, No acute distress.  Laboratory Data: Component Ref Range & Units (hover) 3 d ago (07/19/24) 10 mo ago (09/15/23) 1 yr ago (06/23/23) 2 yr ago (06/16/22) 3 yr ago (06/03/21) 3 yr ago (05/04/21) 4 yr ago (03/31/20)  Prostate Specific Ag, Serum 7.8 High  6.8 High  CM 9.5 High  CM 6.6 High  CM 6.1 High  CM 8.6 High  CM 5.8 High  CM    Pertinent Imaging: CTU (Oct 2024) -I personally reviewed this imaging.  Morphologically normal bilateral kidneys and ureters.  Significantly enlarged prostate, estimation 140-150 g gland.  IMPRESSION: 1. Enlarged prostate with slight bladder wall thickening, indicative of an element of outlet obstruction. Distal ureters are poorly opacified, limiting evaluation. No additional findings to explain the patient's hematuria. 2.  Aortic atherosclerosis (ICD10-I70.0).     Assessment & Plan:    Benign prostatic hyperplasia with lower urinary tract symptoms, symptom details unspecified Assessment & Plan: Hx of BPH   - s/p TURP in 2017   - 140-150g gland by CT estimation (2024)   - No current medical therapy  PVR 5 cc today  Overall doing well, stable urinary habits, patient is pleased.  We discussed the possibility of adding back an alpha-blocker or finasteride in the future if symptoms progress.   Orders: -     Bladder Scan (Post Void Residual) in office -      Sildenafil  Citrate; Take 0.5 tablets (50 mg total) by mouth as needed for erectile dysfunction.  Dispense: 12 tablet; Refill: 3  Elevated PSA Assessment & Plan: PSA 5-9 range, stable over ~4-5 years PSA 7.8 (Oct 2025), PSAD 0.05  I reviewed his clinical history and PSA data over several years.  He maintains a chronically elevated PSA secondary to 140 g BPH.  Recent PSA of 7.8 is very stable.  We may continue annual screening for now, although consider phasing out routine PSA as he reaches mid 70s.   - PSA in 1 year with follow up   Orders: -     PSA; Future  ED (erectile dysfunction) of organic origin Assessment & Plan: Multifactorial, HTN, HLD, T2DM  We reviewed the basic tenets of erectile dysfunction management, including normal erectile physiology, common contributing factors, and the spectrum of therapeutic options. Conservative measures such as lifestyle modification, optimization of comorbid conditions, and avoidance of exacerbating medications were discussed. Pharmacologic options including PDE5 inhibitors, intracavernosal or intraurethral therapies, and vacuum erection devices were reviewed, as well as surgical approaches such as penile prosthesis implantation. All questions were answered.  - trial of 1/2 tablet 100mg  sildenafil  PRN (50mg )   Orders: -     Sildenafil  Citrate; Take 0.5 tablets (50 mg total) by  mouth as needed for erectile dysfunction.  Dispense: 12 tablet; Refill: 3       Penne JONELLE Skye, MD  [facility] [address] [place], [place] [address] [phone]

## 2024-07-19 ENCOUNTER — Other Ambulatory Visit: Payer: Self-pay

## 2024-07-19 DIAGNOSIS — R972 Elevated prostate specific antigen [PSA]: Secondary | ICD-10-CM

## 2024-07-20 LAB — PSA: Prostate Specific Ag, Serum: 7.8 ng/mL — ABNORMAL HIGH (ref 0.0–4.0)

## 2024-07-22 ENCOUNTER — Encounter: Payer: Self-pay | Admitting: Urology

## 2024-07-22 ENCOUNTER — Ambulatory Visit: Admitting: Urology

## 2024-07-22 VITALS — BP 130/81 | HR 69 | Ht 72.0 in | Wt 264.0 lb

## 2024-07-22 DIAGNOSIS — N529 Male erectile dysfunction, unspecified: Secondary | ICD-10-CM | POA: Diagnosis not present

## 2024-07-22 DIAGNOSIS — R972 Elevated prostate specific antigen [PSA]: Secondary | ICD-10-CM | POA: Diagnosis not present

## 2024-07-22 DIAGNOSIS — N401 Enlarged prostate with lower urinary tract symptoms: Secondary | ICD-10-CM | POA: Diagnosis not present

## 2024-07-22 LAB — BLADDER SCAN AMB NON-IMAGING

## 2024-07-22 MED ORDER — SILDENAFIL CITRATE 100 MG PO TABS: 50.0000 mg | ORAL_TABLET | ORAL | 3 refills | Status: AC | PRN

## 2024-07-22 MED ORDER — SILDENAFIL CITRATE 100 MG PO TABS: 50.0000 mg | ORAL_TABLET | ORAL | 3 refills | Status: DC | PRN

## 2024-07-22 NOTE — Assessment & Plan Note (Signed)
 Multifactorial, HTN, HLD, T2DM  We reviewed the basic tenets of erectile dysfunction management, including normal erectile physiology, common contributing factors, and the spectrum of therapeutic options. Conservative measures such as lifestyle modification, optimization of comorbid conditions, and avoidance of exacerbating medications were discussed. Pharmacologic options including PDE5 inhibitors, intracavernosal or intraurethral therapies, and vacuum erection devices were reviewed, as well as surgical approaches such as penile prosthesis implantation. All questions were answered.  - trial of 1/2 tablet 100mg  sildenafil  PRN (50mg )

## 2024-07-24 ENCOUNTER — Ambulatory Visit: Payer: Self-pay | Admitting: Urology

## 2025-07-21 ENCOUNTER — Other Ambulatory Visit

## 2025-07-24 ENCOUNTER — Ambulatory Visit: Admitting: Urology
